# Patient Record
Sex: Female | Born: 1970 | Race: White | Hispanic: No | Marital: Married | Smoking: Never smoker
Health system: Southern US, Community
[De-identification: ages and names within clinical notes are randomized; demographics above are authoritative.]

## PROBLEM LIST (undated history)

## (undated) DIAGNOSIS — E785 Hyperlipidemia, unspecified: Secondary | ICD-10-CM

## (undated) HISTORY — PX: KNEE SURGERY: SHX244

## (undated) HISTORY — PX: CHOLECYSTECTOMY: SHX55

---

## 1997-11-15 ENCOUNTER — Other Ambulatory Visit: Admission: RE | Admit: 1997-11-15 | Discharge: 1997-11-15 | Payer: Self-pay | Admitting: Gynecology

## 1998-04-18 ENCOUNTER — Encounter: Admission: RE | Admit: 1998-04-18 | Discharge: 1998-07-17 | Payer: Self-pay | Admitting: Gynecology

## 1998-07-17 ENCOUNTER — Encounter: Admission: RE | Admit: 1998-07-17 | Discharge: 1998-10-15 | Payer: Self-pay | Admitting: Obstetrics and Gynecology

## 1998-10-10 ENCOUNTER — Inpatient Hospital Stay (HOSPITAL_COMMUNITY): Admission: AD | Admit: 1998-10-10 | Discharge: 1998-10-12 | Payer: Self-pay | Admitting: Gynecology

## 1998-10-17 ENCOUNTER — Encounter (HOSPITAL_COMMUNITY): Admission: RE | Admit: 1998-10-17 | Discharge: 1999-01-15 | Payer: Self-pay | Admitting: Gynecology

## 2003-04-18 ENCOUNTER — Other Ambulatory Visit: Admission: RE | Admit: 2003-04-18 | Discharge: 2003-04-18 | Payer: Self-pay | Admitting: Gynecology

## 2004-06-24 ENCOUNTER — Other Ambulatory Visit: Admission: RE | Admit: 2004-06-24 | Discharge: 2004-06-24 | Payer: Self-pay | Admitting: Gynecology

## 2004-09-29 ENCOUNTER — Inpatient Hospital Stay (HOSPITAL_COMMUNITY): Admission: AD | Admit: 2004-09-29 | Discharge: 2004-09-29 | Payer: Self-pay | Admitting: Gynecology

## 2004-10-15 ENCOUNTER — Encounter: Admission: RE | Admit: 2004-10-15 | Discharge: 2004-10-15 | Payer: Self-pay | Admitting: Women's Health

## 2005-01-11 ENCOUNTER — Inpatient Hospital Stay (HOSPITAL_COMMUNITY): Admission: AD | Admit: 2005-01-11 | Discharge: 2005-01-14 | Payer: Self-pay | Admitting: Gynecology

## 2005-01-11 ENCOUNTER — Encounter (INDEPENDENT_AMBULATORY_CARE_PROVIDER_SITE_OTHER): Payer: Self-pay | Admitting: Specialist

## 2005-01-15 ENCOUNTER — Encounter: Admission: RE | Admit: 2005-01-15 | Discharge: 2005-02-14 | Payer: Self-pay | Admitting: Gynecology

## 2005-02-25 ENCOUNTER — Other Ambulatory Visit: Admission: RE | Admit: 2005-02-25 | Discharge: 2005-02-25 | Payer: Self-pay | Admitting: Gynecology

## 2005-03-17 ENCOUNTER — Encounter: Admission: RE | Admit: 2005-03-17 | Discharge: 2005-04-16 | Payer: Self-pay | Admitting: Gynecology

## 2005-05-17 ENCOUNTER — Encounter: Admission: RE | Admit: 2005-05-17 | Discharge: 2005-06-16 | Payer: Self-pay | Admitting: Gynecology

## 2005-06-17 ENCOUNTER — Encounter: Admission: RE | Admit: 2005-06-17 | Discharge: 2005-07-16 | Payer: Self-pay | Admitting: Gynecology

## 2005-07-17 ENCOUNTER — Encounter: Admission: RE | Admit: 2005-07-17 | Discharge: 2005-08-16 | Payer: Self-pay | Admitting: Gynecology

## 2005-08-17 ENCOUNTER — Encounter: Admission: RE | Admit: 2005-08-17 | Discharge: 2005-09-16 | Payer: Self-pay | Admitting: Gynecology

## 2006-07-06 ENCOUNTER — Other Ambulatory Visit: Admission: RE | Admit: 2006-07-06 | Discharge: 2006-07-06 | Payer: Self-pay | Admitting: Gynecology

## 2008-03-22 ENCOUNTER — Other Ambulatory Visit: Admission: RE | Admit: 2008-03-22 | Discharge: 2008-03-22 | Payer: Self-pay | Admitting: Gynecology

## 2011-01-06 ENCOUNTER — Ambulatory Visit (INDEPENDENT_AMBULATORY_CARE_PROVIDER_SITE_OTHER): Payer: Commercial Indemnity | Admitting: Gynecology

## 2011-01-06 DIAGNOSIS — B373 Candidiasis of vulva and vagina: Secondary | ICD-10-CM

## 2011-01-06 DIAGNOSIS — Z01419 Encounter for gynecological examination (general) (routine) without abnormal findings: Secondary | ICD-10-CM

## 2011-01-06 DIAGNOSIS — B3731 Acute candidiasis of vulva and vagina: Secondary | ICD-10-CM

## 2011-01-10 ENCOUNTER — Other Ambulatory Visit (INDEPENDENT_AMBULATORY_CARE_PROVIDER_SITE_OTHER): Payer: Commercial Indemnity

## 2011-01-10 DIAGNOSIS — E7800 Pure hypercholesterolemia, unspecified: Secondary | ICD-10-CM

## 2011-02-03 ENCOUNTER — Other Ambulatory Visit: Payer: Commercial Indemnity

## 2011-02-14 NOTE — H&P (Signed)
[name], HAKIM NO.:  [id number]   MEDICAL RECORD NO.:  [id number]          PATIENT TYPE:  INP   LOCATION:  9198                          FACILITY:  [facility]   PHYSICIAN:  Charles A. Delcambre, MDDATE OF BIRTH:  November 17, 1970   DATE OF ADMISSION:  01/11/2005  DATE OF DISCHARGE:                                HISTORY & PHYSICAL   CHIEF COMPLAINT:  Onset of labor.   HISTORY OF PRESENT ILLNESS:  A 40 year old para 1-0-2-1, presents at 39  weeks, 5 days, complaining of onset of contractions 0300 this morning.  Noted to be 1 cm dilated this morning upon admission but having late  decelerations.  IV fluid, O2 was placed.  Position changes were noted, and  she developed moderate, recurrent decelerations, lasting at times a minute  in length and down to the 70s to 80s from 150s.  These were unresponsive to  resuscitative measures, and I recommended cesarean section in that she was  remote from delivery.  She gave informed consent, accepts risks of  infection, bleeding, bowel or bladder damage, blood product risk including  hepatitis and HIV exposure, ureteral damage, incision infection.  All  questions were answered; informed consent was given.   PAST MEDICAL HISTORY:  Gestational diabetes, diet controlled.   PAST SURGICAL HISTORY:  1.  Cholecystectomy.  2.  Vaginal delivery.  3.  Two ICVs.  4.  No D&C's.   ALLERGIES:  DILAUDID, CODEINE, reactions not specified.   MEDICATIONS:  Prenatal vitamins.   SOCIAL HISTORY:  No tobacco, ethanol, or drug use.  The patient is married.   FAMILY HISTORY:  Noncontributory.   REVIEW OF SYSTEMS:  No shortness of breath, wheezing.  She states she has T-  wave abnormality but asymptomatic no medications.   PRENATAL LABS:  See Hollister's but positive, rubella immune, last  hemoglobin at 28 weeks 12.9.   PHYSICAL EXAMINATION:  VITAL SIGNS:  Normal blood pressure per RN.  HEENT:  Grossly normal.  NECK:  Supple.  CORONARY:   Regular rate and rhythm.  ABDOMEN:  Gravid, consistent with term.  PELVIC:  1 cm per RN.  EXTREMITIES:  Mild edema.   Fetal heart rate as described above.   ASSESSMENT:  1.  Term pregnancy.  2.  Gestational diabetes, diet controlled.  3.  Nonreassuring fetal heart rate.   As far as ongoing labor, there is currently variability present but not  responsive fetal heart rate measured to reassuring status with resuscitative  measures enough to allow further trial of labor.  Recommendation for  cesarean section is made.  The patient consents.   PLAN:  Move to cesarean section.  We will proceed as outlined.      CAD/MEDQ  D:  01/11/2005  T:  01/11/2005  Job:  [id number]

## 2011-02-14 NOTE — Discharge Summary (Signed)
[name], [name]                ACCOUNT NO.:  [id number]   MEDICAL RECORD NO.:  [id number]          PATIENT TYPE:  INP   LOCATION:  9122                          FACILITY:  [facility]   PHYSICIAN:  Ivor Costa. Farrel Gobble, M.D. DATE OF BIRTH:  [birth year]   DATE OF ADMISSION:  01/11/2005  DATE OF DISCHARGE:                                 DISCHARGE SUMMARY   PRINCIPAL DIAGNOSES:  1.  Thirty-nine-plus-week pregnancy.  2.  Nonreassuring fetal heart rate tracing.   PRINCIPAL PROCEDURE:  Primary cesarean section.   HOSPITAL COURSE:  The patient presented to labor and delivery complaining of  contractions, was known to be minimally dilated, but was having recurrent  late decelerations despite normal resuscitative efforts, failed to improve,  and therefore the patient was transferred from triage to the OR for a  primary cesarean section for nonreassuring fetal heart rate tracing. The  patient is status post delivery of a viable female at 4 pounds 12 ounces  with assuring cord gases. Apgars were 7 and 9. The patient was then  transferred from the OR to the postpartum floor in normal fashion. Her  postpartum course was unremarkable. At the point of discharge - which is  postoperative day #3 - the patient was tolerating regular diet, ambulating  without difficulty, her pain was well controlled with over-the-counter  Motrin and Percocet. The patient was pumping and bottle feeding the baby.  The baby was ready for discharge.   POSTOPERATIVE LABORATORY DATA:  Her white count was 10.5, her hemoglobin was  10.4, her hematocrit was 29.7, her platelets were 148. Pathology:  Placenta,  which is pending.   DISCHARGE PRESCRIPTIONS:  Percocet 5 one to two q.4h. p.r.n. pain #30.   DISCHARGE CONDITION:  Stable. Instructed to follow up in the office in 6  weeks.      THL/MEDQ  D:  01/14/2005  T:  01/14/2005  Job:  [id number]

## 2011-02-14 NOTE — Op Note (Signed)
[name], [name]                ACCOUNT NO.:  [id number]   MEDICAL RECORD NO.:  [id number]          PATIENT TYPE:  INP   LOCATION:  9122                          [facility]ACILITY:  [facility]   PHYSICIAN:  Charles A. Delcambre, MDDATE OF BIRTH:  18-Mar-1971   DATE OF PROCEDURE:  01/11/2005  DATE OF DISCHARGE:                                 OPERATIVE REPORT   PREOPERATIVE DIAGNOSES:  1.  Intrauterine pregnancy at 39 weeks, five days.  2.  Class A1 diabetes mellitus, gestational.  3.  Nonreassuring fetal heart rate.   POSTOPERATIVE DIAGNOSES:  1.  Intrauterine pregnancy at 39 weeks, five days.  2.  Class A1 diabetes mellitus, gestational.  3.  Nonreassuring fetal heart rate.  4.  Nuchal cord x 2.  5.  Thick meconium.  6.  Probable intrauterine growth restriction.   PROCEDURE:  Primary low transverse cesarean section.   SURGEON:  Charles A. Sydnee Cabal, M.D.   ASSISTANT:  Ilda Mori, M.D.   COMPLICATIONS:  None.   ESTIMATED BLOOD LOSS:  800 mL.   SPECIMENS:  Placenta to pathology.   CORD BLOOD GAS:  Cord arterial blood gas 7.26, remainder of full gas not  received at this time.  Cord venous blood gas 7.31.   BABY'S WEIGHT:  4 pounds 12 ounces, vigorous female.   OPERATIVE FINDINGS:  Normal uterus and tubes and ovaries.   ANESTHESIA:  Spinal anesthesia.   COUNTS:  Instrument, sponge, and needle counts were correct x 2.   URINE OUTPUT:  200 mL clear urine.   DESCRIPTION OF PROCEDURE:  The patient was taken to the operating room and  placed in the supine position.  After spinal was induced, sterile prep and  drape was undertaken.  A Pfannenstiel incision was made with a knife,  carried down to fascia.  The fascia was incised with a knife and Mayo  scissors.  The rectus sheath was released superiorly and inferiorly.  The  rectus muscles were sharply dissected in the midline, the peritoneum was  entered bluntly.  Traction was used to extend the peritoneal incision.  The  bladder  blade was placed, vesicouterine peritoneum was incised with  Metzenbaum scissors.  Blunt dissection was used to develop the bladder flap.  The lower uterine segment incision was made after replacement of the bladder  flap to amniotomy.  There was no damage to the infant.  Traction was used to  extend the incision.  Thick meconium was noted.  The occiput was lifted, and  fundal pressure was placed by the operator's assistant.  DeLee suction was  done for the nares and the mouth prior to delivery of the shoulders.  Nuchal  cord was reduced x 2.  Cord was clamped, vigorous cry was noted, bulb  suction was done.  The infant was cut free and handed to the neonatologist  in attendance.  The placenta was expressed after cord gas and cord blood  were collected.  The placenta was sent to pathology.  Moistened lap was used  to wipe the uterus internal surface.  Then #1 chromic was used to close the  uterus in two layers, first layer running, locking, second layer imbricating  over the first, nonlocking.  Hemostasis was excellent.  Irrigation was  carried out.  Bladder flap hemostasis was excellent.  The paracolic gutters  were cleansed of clot or blood material.  Hemostasis was again verified,  subfascial hemostasis was excellent.  The fascia was then closed with #1  Vicryl, running, nonlocking suture.  Subcutaneous hemostasis was excellent,  2-0 Vicryl subcutaneous layer closure was undertaken.  Sterile skin clips  were used to close the skin.  A sterile dressing was applied.  The patient  was taken to the recovery room with physician in attendance, having  tolerated the procedure well.      CAD/MEDQ  D:  01/11/2005  T:  01/11/2005  Job:  [id number]

## 2011-12-16 ENCOUNTER — Encounter: Payer: Self-pay | Admitting: Gynecology

## 2013-05-04 ENCOUNTER — Encounter: Payer: Self-pay | Admitting: Gynecology

## 2013-05-30 ENCOUNTER — Emergency Department (INDEPENDENT_AMBULATORY_CARE_PROVIDER_SITE_OTHER): Payer: BC Managed Care – PPO

## 2013-05-30 ENCOUNTER — Emergency Department
Admission: EM | Admit: 2013-05-30 | Discharge: 2013-05-30 | Disposition: A | Discharge status: Home / Self Care | Payer: BC Managed Care – PPO | Source: Home / Self Care | Attending: Family Medicine | Admitting: Family Medicine

## 2013-05-30 ENCOUNTER — Encounter: Payer: Self-pay | Admitting: *Deleted

## 2013-05-30 DIAGNOSIS — R937 Abnormal findings on diagnostic imaging of other parts of musculoskeletal system: Secondary | ICD-10-CM

## 2013-05-30 DIAGNOSIS — M7730 Calcaneal spur, unspecified foot: Secondary | ICD-10-CM

## 2013-05-30 DIAGNOSIS — S83419A Sprain of medial collateral ligament of unspecified knee, initial encounter: Secondary | ICD-10-CM

## 2013-05-30 DIAGNOSIS — S82001A Unspecified fracture of right patella, initial encounter for closed fracture: Secondary | ICD-10-CM

## 2013-05-30 DIAGNOSIS — M25569 Pain in unspecified knee: Secondary | ICD-10-CM

## 2013-05-30 DIAGNOSIS — S82009A Unspecified fracture of unspecified patella, initial encounter for closed fracture: Secondary | ICD-10-CM

## 2013-05-30 DIAGNOSIS — S83411A Sprain of medial collateral ligament of right knee, initial encounter: Secondary | ICD-10-CM

## 2013-05-30 DIAGNOSIS — W19XXXA Unspecified fall, initial encounter: Secondary | ICD-10-CM

## 2013-05-30 HISTORY — DX: Hyperlipidemia, unspecified: E78.5

## 2013-05-30 NOTE — ED Notes (Signed)
Pt c/o RT knee and RT foot injury x yesterday after falling down the steps at home. She applied ice and took IBF last night. No OTC meds today.

## 2013-05-30 NOTE — ED Provider Notes (Signed)
CSN: [id number]     Arrival date & time 05/30/13  0931 History   First MD Initiated Contact with Patient 05/30/13 [phone]     Chief Complaint  Patient presents with  . Knee Injury  . Foot Injury      HPI Comments: Patient slipped yesterday and slid down a flight of carpeted stairs.  She believes that her right knee was hyperflexed, ending up with her right foot behind her laterally.  She complains of pain in her right medial knee, right pre-tibial area, and right lateral foot.  Patient is a 42 y.o. female presenting with knee pain. The history is provided by the patient and the spouse.  Knee Pain Location:  Leg, foot and knee Time since incident:  18 hours Injury: yes   Mechanism of injury: fall   Fall:    Fall occurred:  Down stairs   Impact surface:  Water quality scientist of impact:  Buttocks Leg location:  R lower leg Knee location:  R knee Foot location:  R foot Pain details:    Quality:  Aching   Radiates to:  Does not radiate   Severity:  Moderate   Onset quality:  Sudden   Duration:  18 hours   Timing:  Constant   Progression:  Unchanged Chronicity:  New Dislocation: no   Prior injury to area:  No Relieved by:  Nothing Worsened by:  Bearing weight Ineffective treatments:  NSAIDs Associated symptoms: decreased ROM, stiffness and swelling   Associated symptoms: no back pain, no muscle weakness, no numbness and no tingling   Risk factors: obesity     Past Medical History  Diagnosis Date  . Hyperlipemia    Past Surgical History  Procedure Laterality Date  . Cholecystectomy     Family History  Problem Relation Age of Onset  . Diabetes Mother   . Hypertension Mother   . Hyperlipidemia Mother   . Diabetes Father   . Hyperlipidemia Father   . Hypertension Father   . Cancer Sister     breast  . Hypertension Sister   . Hypertension Brother    History  Substance Use Topics  . Smoking status: Never Smoker   . Smokeless tobacco: Not on file  . Alcohol Use: No   OB  History   Grav Para Term Preterm Abortions TAB SAB Ect Mult Living                 Review of Systems  Musculoskeletal: Positive for stiffness. Negative for back pain.  All other systems reviewed and are negative.    Allergies  Dilaudid  Home Medications  No current outpatient prescriptions on file. BP 125/84  Pulse 78  Temp(Src) 97.9 F (36.6 C) (Oral)  Resp 18  SpO2 99%  LMP 05/09/2013 Physical Exam  Constitutional: She is oriented to person, place, and time. She appears well-developed and well-nourished. No distress.  HENT:  Head: Atraumatic.  Eyes: Conjunctivae are normal. Pupils are equal, round, and reactive to light.  Musculoskeletal:       Right knee: She exhibits decreased range of motion and bony tenderness. She exhibits no swelling, no effusion, no ecchymosis, no deformity, no laceration, no erythema, normal alignment, no LCL laxity, normal patellar mobility, normal meniscus and no MCL laxity. Tenderness found. Medial joint line and MCL tenderness noted. No lateral joint line, no LCL and no patellar tendon tenderness noted.       Left lower leg: She exhibits tenderness and bony tenderness. She exhibits  no swelling, no edema, no deformity and no laceration.       Legs:      Right foot: She exhibits tenderness and bony tenderness. She exhibits normal range of motion, no swelling, normal capillary refill, no crepitus, no deformity and no laceration.  There is distinct tenderness over the right MCL and joint line.  There is distinct point tenderness over the medial inferior aspect of patella.  Knee stable; negative drawer.  Patient has pain with full extension and flexion.  Right knee has minimal swelling.  There is tenderness over the lower pre-tibial area with minimal ecchymosis present and no swelling.  There is tenderness over the right foot, 5th metatarsal  Neurological: She is alert and oriented to person, place, and time.  Skin: Skin is warm and dry.    ED  Course  Procedures  none     Imaging Review Dg Tibia/fibula Right  05/30/2013   *RADIOLOGY REPORT*  Clinical Data: Recent traumatic injury with pain  RIGHT TIBIA AND FIBULA - 2 VIEW  Comparison: None.  Findings: No acute fracture or dislocation is noted.  No soft tissue changes are noted.  IMPRESSION: No acute abnormality noted.   Original Report Authenticated By: Alcide Clever, M.D.   Dg Knee Complete 4 Views Right  05/30/2013   *RADIOLOGY REPORT*  Clinical Data: Medial knee pain  RIGHT KNEE - COMPLETE 4+ VIEW  Comparison: None.  Findings: Bony density is noted adjacent to the medial aspect of the patella.  This is only seen on the oblique images.  This may represent a mild avulsion fracture.  No other focal abnormality is seen.  IMPRESSION: Question patellar avulsion fracture medially. Correlation with physical exam is recommended.   Original Report Authenticated By: Alcide Clever, M.D.   Dg Foot Complete Right  05/30/2013   *RADIOLOGY REPORT*  Clinical Data: Recent foot injury  RIGHT FOOT COMPLETE - 3+ VIEW  Comparison: None.  Findings: No acute fracture or dislocation is noted.  A small plantar spur is seen.  IMPRESSION: Small plantar spur.  No acute abnormality is noted.   Original Report Authenticated By: Alcide Clever, M.D.    MDM   1. Knee MCL sprain, right, initial encounter   2. Patella fracture, right, closed, initial encounter    Knee immobilizer applied.  Dispensed crutches. Elevate leg.  Apply ice pack several times daily.  May take Tylenol for pain. Followup with Dr. Rodney Langton in about two days.    Lattie Haw, MD 05/30/13 [phone]

## 2013-05-31 ENCOUNTER — Telehealth: Payer: Self-pay | Admitting: *Deleted

## 2013-06-02 ENCOUNTER — Ambulatory Visit (INDEPENDENT_AMBULATORY_CARE_PROVIDER_SITE_OTHER): Payer: BC Managed Care – PPO | Admitting: Sports Medicine

## 2013-06-02 ENCOUNTER — Ambulatory Visit (INDEPENDENT_AMBULATORY_CARE_PROVIDER_SITE_OTHER): Payer: BC Managed Care – PPO

## 2013-06-02 ENCOUNTER — Encounter: Payer: Self-pay | Admitting: Sports Medicine

## 2013-06-02 ENCOUNTER — Telehealth: Payer: Self-pay | Admitting: *Deleted

## 2013-06-02 VITALS — BP 110/77 | HR 78 | Wt 222.0 lb

## 2013-06-02 DIAGNOSIS — M25561 Pain in right knee: Secondary | ICD-10-CM

## 2013-06-02 DIAGNOSIS — M25569 Pain in unspecified knee: Secondary | ICD-10-CM

## 2013-06-02 DIAGNOSIS — M1711 Unilateral primary osteoarthritis, right knee: Secondary | ICD-10-CM | POA: Insufficient documentation

## 2013-06-02 DIAGNOSIS — M898X9 Other specified disorders of bone, unspecified site: Secondary | ICD-10-CM

## 2013-06-02 NOTE — Telephone Encounter (Signed)
CT right knee w/o contrast percerted through [employer].  Auth # [id number].  Myriam Jacobson with [facility] notified. Barry Dienes, LPN

## 2013-06-02 NOTE — Progress Notes (Signed)
   Subjective:    I'm seeing this patient as a consultation for:  Dr. Cathren Harsh  Primary care provider: Dr. Blair Heys.  CC: Right knee pain  HPI: This is a very pleasant 42 year old female, unfortunately 3 days ago she fell down the stairs hyperflexing her right knee. She had immediate pain and swelling. She was seen in the urgent care where x-rays showed an abnormality just lateral to the patella. She was referred to me for further evaluation and definitive treatment. She really doesn't have much pain around her knee, predominately her pain is at the medial tibial insertion of the MCL. Pain is severe, persistent, no radiation.  Past medical history, Surgical history, Family history not pertinant except as noted below, Social history, Allergies, and medications have been entered into the medical record, reviewed, and no changes needed.   Review of Systems: No headache, visual changes, nausea, vomiting, diarrhea, constipation, dizziness, abdominal pain, skin rash, fevers, chills, night sweats, weight loss, swollen lymph nodes, body aches, joint swelling, muscle aches, chest pain, shortness of breath, mood changes, visual or auditory hallucinations.   Objective:   General: Well Developed, well nourished, and in no acute distress.  Neuro/Psych: Alert and oriented x3, extra-ocular muscles intact, able to move all 4 extremities, sensation grossly intact. Skin: Warm and dry, no rashes noted.  Respiratory: Not using accessory muscles, speaking in full sentences, trachea midline.  Cardiovascular: Pulses palpable, no extremity edema. Abdomen: Does not appear distended. Right knee: Minimally swollen, no tenderness to palpation around the patella, extensor mechanism is intact. She does have tenderness along the entire medial collateral ligament, anterior cruciate ligament, PCL, and LCL are all stable. There is no joint line opening was applied valgus stress.  I reviewed her x-rays, there does appear  to be a lesion along the medial patellar margin that may represent a patellar avulsion, however she has no pain in this area.  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 There is some stranding around the MCL, no fractures are noted.  Impression and Recommendations:   This case required medical decision making of moderate complexity.

## 2013-06-02 NOTE — Assessment & Plan Note (Addendum)
[name] is fairly asymptomatic around the patella, she has marked tenderness over the MCL, however this ligament is stable. I do suspect that this represents a grade 2 MCL sprain. I do see a defect on the lateral femoral condyle, for this reason I would like a CT scan of her knee to further evaluate bony injury. Hinged knee brace, pain is well controlled with Tylenol. Like to see her back in about 2 weeks, she should stay on crutches for now.  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

## 2013-06-16 ENCOUNTER — Encounter: Payer: Self-pay | Admitting: Sports Medicine

## 2013-06-16 ENCOUNTER — Ambulatory Visit (INDEPENDENT_AMBULATORY_CARE_PROVIDER_SITE_OTHER): Payer: BC Managed Care – PPO | Admitting: Sports Medicine

## 2013-06-16 VITALS — BP 107/73 | HR 71 | Wt 222.0 lb

## 2013-06-16 DIAGNOSIS — M25569 Pain in unspecified knee: Secondary | ICD-10-CM

## 2013-06-16 DIAGNOSIS — M25561 Pain in right knee: Secondary | ICD-10-CM

## 2013-06-16 NOTE — Assessment & Plan Note (Signed)
Likely this represents an MCL sprain, 70% improved. Continue knee brace. 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 Continue ibuprofen as needed. Discontinue crutches, return to see me in 2 weeks, I will likely turn her loose afterwards.

## 2013-06-16 NOTE — Progress Notes (Signed)
  Subjective:    CC: Followup  HPI: Right knee pain: [name] had an MCL sprain approximately 2-3 weeks ago. I placed her in a hinged knee brace, advised her to rest. She returns today approximately 70% improved, able to bear weight without pain.  Past medical history, Surgical history, Family history not pertinant except as noted below, Social history, Allergies, and medications have been entered into the medical record, reviewed, and no changes needed.   Review of Systems: No fevers, chills, night sweats, weight loss, chest pain, or shortness of breath.   Objective:    General: Well Developed, well nourished, and in no acute distress.  Neuro: Alert and oriented x3, extra-ocular muscles intact, sensation grossly intact.  HEENT: Normocephalic, atraumatic, pupils equal round reactive to light, neck supple, no masses, no lymphadenopathy, thyroid nonpalpable.  Skin: Warm and dry, no rashes. Cardiac: Regular rate and rhythm, no murmurs rubs or gallops, no lower extremity edema.  Respiratory: Clear to auscultation bilaterally. Not using accessory muscles, speaking in full sentences. Right Knee: Normal to inspection with no erythema or effusion or obvious bony abnormalities. Palpation normal with no warmth, joint line tenderness, patellar tenderness, or condyle tenderness. ROM full in flexion and extension and lower leg rotation. Ligaments with solid consistent endpoints including ACL, PCL, LCL, MCL. MCL still has extremely mild laxity with approximately 3 of joint line opening with valgus stress. Negative Mcmurray's, Apley's, and Thessalonian tests. Non painful patellar compression. Patellar glide without crepitus. Patellar and quadriceps tendons unremarkable. Hamstring and quadriceps strength is normal.   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  Impression and Recommendations:

## 2013-06-30 ENCOUNTER — Encounter: Payer: Self-pay | Admitting: Sports Medicine

## 2013-06-30 ENCOUNTER — Ambulatory Visit (INDEPENDENT_AMBULATORY_CARE_PROVIDER_SITE_OTHER): Payer: BC Managed Care – PPO | Admitting: Sports Medicine

## 2013-06-30 VITALS — BP 107/79 | HR 80 | Wt 220.0 lb

## 2013-06-30 DIAGNOSIS — M25579 Pain in unspecified ankle and joints of unspecified foot: Secondary | ICD-10-CM

## 2013-06-30 DIAGNOSIS — M25561 Pain in right knee: Secondary | ICD-10-CM

## 2013-06-30 DIAGNOSIS — M25569 Pain in unspecified knee: Secondary | ICD-10-CM

## 2013-06-30 DIAGNOSIS — M25571 Pain in right ankle and joints of right foot: Secondary | ICD-10-CM | POA: Insufficient documentation

## 2013-06-30 NOTE — Assessment & Plan Note (Signed)
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 This continues to improve, and she is able to jump up and down the right leg today. Discontinue brace.

## 2013-06-30 NOTE — Progress Notes (Signed)
  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 She continues to improve on a daily basis. Symptoms are mild, she is not limited in function at all.  Right ankle pain: Occasionally gets swelling, pain over the dorsal lateral midfoot, worse when bearing weight, no radiation, moderate.  Past medical history, Surgical history, Family history not pertinant except as noted below, Social history, Allergies, and medications have been entered into the medical record, reviewed, and no changes needed.   Review of Systems: No fevers, chills, night sweats, weight loss, chest pain, or shortness of breath.   Objective:    General: Well Developed, well nourished, and in no acute distress.  Neuro: Alert and oriented x3, extra-ocular muscles intact, sensation grossly intact.  HEENT: Normocephalic, atraumatic, pupils equal round reactive to light, neck supple, no masses, no lymphadenopathy, thyroid nonpalpable.  Skin: Warm and dry, no rashes. Cardiac: Regular rate and rhythm, no murmurs rubs or gallops, no lower extremity edema.  Respiratory: Clear to auscultation bilaterally. Not using accessory muscles, speaking in full sentences. Right Knee: Normal to inspection with no erythema or effusion or obvious bony abnormalities. Palpation normal with no warmth, joint line tenderness, patellar tenderness, or condyle tenderness. ROM full in flexion and extension and lower leg rotation. Ligaments with solid consistent endpoints including ACL, PCL, LCL, MCL. Negative Mcmurray's, Apley's, and Thessalonian tests. Non painful patellar compression. Patellar glide without crepitus. Patellar and quadriceps tendons unremarkable. Hamstring and quadriceps strength is normal.  Right Ankle: No visible erythema or swelling. Range of motion is full in all directions. Strength is  5/5 in all directions. Stable lateral and medial ligaments; squeeze test and kleiger test unremarkable; Talar dome nontender; No pain at base of 5th MT; No tenderness over cuboid; No tenderness over N spot or navicular prominence No tenderness on posterior aspects of lateral and medial malleolus No sign of peroneal tendon subluxations or tenderness to palpation Negative tarsal tunnel tinel's Able to walk 4 steps. Patient is able to jump up and down easily on the affected extremity.  Impression and Recommendations:

## 2013-06-30 NOTE — Assessment & Plan Note (Signed)
This continues to improve and has not yet plateaued. Likely does represent soft tissue contusion and ligamentous sprain. If plateaus, or does not get better in 4 weeks we will MRI the ankle.

## 2013-07-11 ENCOUNTER — Encounter: Payer: Self-pay | Admitting: Sports Medicine

## 2013-07-11 ENCOUNTER — Ambulatory Visit (INDEPENDENT_AMBULATORY_CARE_PROVIDER_SITE_OTHER): Payer: BC Managed Care – PPO | Admitting: Sports Medicine

## 2013-07-11 VITALS — BP 133/88 | HR 88 | Wt 223.0 lb

## 2013-07-11 DIAGNOSIS — M25571 Pain in right ankle and joints of right foot: Secondary | ICD-10-CM

## 2013-07-11 DIAGNOSIS — M25561 Pain in right knee: Secondary | ICD-10-CM

## 2013-07-11 DIAGNOSIS — M25569 Pain in unspecified knee: Secondary | ICD-10-CM

## 2013-07-11 DIAGNOSIS — M25579 Pain in unspecified ankle and joints of unspecified foot: Secondary | ICD-10-CM

## 2013-07-11 NOTE — Progress Notes (Signed)
  Subjective:    CC: Followup  HPI: Right knee pain: Improvement seems to have plateaued, initial diagnosis was MCL sprain. She now has pain which he localizes at the posterior medial joint line, worse with flexing her knee. Pain is moderate, persistent.  Ankle pain: Persistent, has not yet been doing her home exercises.  Past medical history, Surgical history, Family history not pertinant except as noted below, Social history, Allergies, and medications have been entered into the medical record, reviewed, and no changes needed.   Review of Systems: No fevers, chills, night sweats, weight loss, chest pain, or shortness of breath.   Objective:    General: Well Developed, well nourished, and in no acute distress.  Neuro: Alert and oriented x3, extra-ocular muscles intact, sensation grossly intact.  HEENT: Normocephalic, atraumatic, pupils equal round reactive to light, neck supple, no masses, no lymphadenopathy, thyroid nonpalpable.  Skin: Warm and dry, no rashes. Cardiac: Regular rate and rhythm, no murmurs rubs or gallops, no lower extremity edema.  Respiratory: Clear to auscultation bilaterally. Not using accessory muscles, speaking in full sentences.  Procedure: Real-time Ultrasound Guided Injection of right knee Device: GE Logiq E  Verbal informed consent obtained.  Time-out conducted.  Noted no overlying erythema, induration, or other signs of local infection.  Skin prepped in a sterile fashion.  Local anesthesia: Topical Ethyl chloride.  With sterile technique and under real time ultrasound guidance:  2 cc Kenalog 40, 4 cc lidocaine injected easily into the suprapatellar recess. Completed without difficulty  Pain immediately resolved suggesting accurate placement of the medication.  Advised to call if fevers/chills, erythema, induration, drainage, or persistent bleeding.  Images permanently stored and available for review in the ultrasound unit.  Impression: Technically  successful ultrasound guided injection.  Impression and Recommendations:

## 2013-07-11 NOTE — Assessment & Plan Note (Signed)
Pain is persistent but she has not been doing any rehabilitation exercises, and add some home exercises, she can return in 2 weeks, MRI if no better.

## 2013-07-11 NOTE — Assessment & Plan Note (Signed)
After a grade 2/3 MCL sprain, persistent pain in the posterior medial as well as into her medial joint lines suggesting meniscal injury. Injection performed today. Return in 2 weeks, we'll consider MRI if no better.

## 2013-07-25 ENCOUNTER — Encounter: Payer: Self-pay | Admitting: Sports Medicine

## 2013-07-25 ENCOUNTER — Ambulatory Visit (INDEPENDENT_AMBULATORY_CARE_PROVIDER_SITE_OTHER): Payer: BC Managed Care – PPO | Admitting: Sports Medicine

## 2013-07-25 VITALS — BP 117/80 | HR 84 | Wt 219.0 lb

## 2013-07-25 DIAGNOSIS — M25579 Pain in unspecified ankle and joints of unspecified foot: Secondary | ICD-10-CM

## 2013-07-25 DIAGNOSIS — M25561 Pain in right knee: Secondary | ICD-10-CM

## 2013-07-25 DIAGNOSIS — M25569 Pain in unspecified knee: Secondary | ICD-10-CM

## 2013-07-25 DIAGNOSIS — M25571 Pain in right ankle and joints of right foot: Secondary | ICD-10-CM

## 2013-07-25 NOTE — Assessment & Plan Note (Signed)
Good result from injection, unfortunately still has pain at the distal insertion of the MCL, with reproduction with valgus stress. She has an effusion. Certainly we need to rule out further intra-articular pathology such as an ACL tear or meniscal tear. I would also like to further characterize her MCL injury. At this point I am going to obtain an MRI.

## 2013-07-25 NOTE — Assessment & Plan Note (Addendum)
I think this is mostly due to dependent edema from her knee injury. I have asked her to elevated, and get a compression stocking as we work up her  knee further.

## 2013-07-25 NOTE — Progress Notes (Signed)
  Subjective:    CC: Follow up  HPI: Right knee pain: [name] returns, it's been almost 2 months since her injury where she fell down the stairs, her initial diagnosis was an MCL injury. She improved significantly with mobilization, unfortunately at the last visit she was having persistent pain, I injected her knee joint. She returns today with pain improved significantly after the injection, but still has occasional sharp pains that she localizes at the tibial insertion of her MCL. The knee is also swelling. No constitutional symptoms, he is moderate, persistent.  Past medical history, Surgical history, Family history not pertinant except as noted below, Social history, Allergies, and medications have been entered into the medical record, reviewed, and no changes needed.   Review of Systems: No fevers, chills, night sweats, weight loss, chest pain, or shortness of breath.   Objective:    General: Well Developed, well nourished, and in no acute distress.  Neuro: Alert and oriented x3, extra-ocular muscles intact, sensation grossly intact.  HEENT: Normocephalic, atraumatic, pupils equal round reactive to light, neck supple, no masses, no lymphadenopathy, thyroid nonpalpable.  Skin: Warm and dry, no rashes. Cardiac: Regular rate and rhythm, no murmurs rubs or gallops, no lower extremity edema.  Respiratory: Clear to auscultation bilaterally. Not using accessory muscles, speaking in full sentences. Right Knee: Visible and palpable effusion. Tender to palpation of the tibial insertion of the MCL, reproduction of pain with valgus stress on the knee, valgus stress does not cause any joint line opening. ROM full in flexion and extension and lower leg rotation. Ligaments with solid consistent endpoints including ACL, PCL, LCL, MCL. Negative Mcmurray's, Apley's, and Thessalonian tests. Non painful patellar compression. Patellar glide without crepitus. Patellar and quadriceps tendons  unremarkable. Hamstring and quadriceps strength is normal.   Impression and Recommendations:

## 2013-07-26 ENCOUNTER — Ambulatory Visit (HOSPITAL_BASED_OUTPATIENT_CLINIC_OR_DEPARTMENT_OTHER)
Admission: RE | Admit: 2013-07-26 | Discharge: 2013-07-26 | Disposition: A | Discharge status: Home / Self Care | Payer: BC Managed Care – PPO | Source: Ambulatory Visit | Attending: Sports Medicine | Admitting: Sports Medicine

## 2013-07-26 ENCOUNTER — Telehealth: Payer: Self-pay | Admitting: *Deleted

## 2013-07-26 DIAGNOSIS — M2240 Chondromalacia patellae, unspecified knee: Secondary | ICD-10-CM | POA: Insufficient documentation

## 2013-07-26 DIAGNOSIS — S83289A Other tear of lateral meniscus, current injury, unspecified knee, initial encounter: Secondary | ICD-10-CM | POA: Insufficient documentation

## 2013-07-26 DIAGNOSIS — M25561 Pain in right knee: Secondary | ICD-10-CM

## 2013-07-26 DIAGNOSIS — X58XXXA Exposure to other specified factors, initial encounter: Secondary | ICD-10-CM | POA: Insufficient documentation

## 2013-07-26 DIAGNOSIS — M76899 Other specified enthesopathies of unspecified lower limb, excluding foot: Secondary | ICD-10-CM | POA: Insufficient documentation

## 2013-07-26 NOTE — Telephone Encounter (Signed)
PA obtained for MRI RT knee w/o contrast. Auth # [id number]. Meyer Cory, LPN

## 2013-07-28 ENCOUNTER — Ambulatory Visit (INDEPENDENT_AMBULATORY_CARE_PROVIDER_SITE_OTHER): Payer: BC Managed Care – PPO | Admitting: Sports Medicine

## 2013-07-28 ENCOUNTER — Encounter: Payer: Self-pay | Admitting: Sports Medicine

## 2013-07-28 VITALS — BP 121/74 | HR 80 | Wt 221.0 lb

## 2013-07-28 DIAGNOSIS — M25571 Pain in right ankle and joints of right foot: Secondary | ICD-10-CM

## 2013-07-28 DIAGNOSIS — M25561 Pain in right knee: Secondary | ICD-10-CM

## 2013-07-28 DIAGNOSIS — M25569 Pain in unspecified knee: Secondary | ICD-10-CM

## 2013-07-28 MED ORDER — TRAMADOL HCL 50 MG PO TABS: ORAL_TABLET | ORAL | Status: DC

## 2013-07-28 NOTE — Assessment & Plan Note (Signed)
She is wearing a compression dressing and feels better.

## 2013-07-28 NOTE — Progress Notes (Signed)
  Subjective:    CC: MRI results  HPI: Right knee pain: Is over 2 months since [name] fell down the stairs injuring her right knee. Clinically I diagnosed her with an MCL sprain. We placed her on rehabilitation, and immobilization, unfortunately she has continued to have pain. More recently I injected her knee which provided approximately 2 weeks of response. Unfortunate she returns with recurrence in pain. MRI results will be dictated below.  Pain is moderate, persistent, still localized at the medial joint line, but also deep to the patella. Pain is worse when going up and down stairs. She does get a grinding sensation.  Past medical history, Surgical history, Family history not pertinant except as noted below, Social history, Allergies, and medications have been entered into the medical record, reviewed, and no changes needed.   Review of Systems: No fevers, chills, night sweats, weight loss, chest pain, or shortness of breath.   Objective:    General: Well Developed, well nourished, and in no acute distress.  Neuro: Alert and oriented x3, extra-ocular muscles intact, sensation grossly intact.  HEENT: Normocephalic, atraumatic, pupils equal round reactive to light, neck supple, no masses, no lymphadenopathy, thyroid nonpalpable.  Skin: Warm and dry, no rashes. Cardiac: Regular rate and rhythm, no murmurs rubs or gallops, no lower extremity edema.  Respiratory: Clear to auscultation bilaterally. Not using accessory muscles, speaking in full sentences. Right Knee: Visible and palpable effusion with a fluid wave. Tender to palpation at the tibial insertion of the MCL. No tenderness to palpation over the lateral joint line in its entirety. ROM full in flexion and extension and lower leg rotation. Ligaments with solid consistent endpoints including ACL, PCL, LCL, MCL. Negative Mcmurray's, Apley's, and Thessalonian tests. Painful patellar compression with crepitus. Patellar and quadriceps  tendons unremarkable. Hamstring and quadriceps strength is normal.   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  Impression and Recommendations:

## 2013-07-28 NOTE — Assessment & Plan Note (Addendum)
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 She also has MRI evidence of a grade 2 MCL sprain. She does have also a lateral meniscal tear which is asymptomatic. She's had a single injection which was helped for 2 weeks, she is going on a trip, and advised that she come back next week just before her trip for a repeat aspiration and injection this time likely with Depo-Medrol. If this is ineffective we can certainly transition to Visco supplementation before consideration of arthroscopy. I would also like her to do some formal physical therapy. Tramadol as needed for pain.

## 2013-07-29 ENCOUNTER — Ambulatory Visit: Payer: BC Managed Care – PPO | Admitting: Sports Medicine

## 2013-08-04 ENCOUNTER — Encounter: Payer: Self-pay | Admitting: Sports Medicine

## 2013-08-04 ENCOUNTER — Ambulatory Visit (INDEPENDENT_AMBULATORY_CARE_PROVIDER_SITE_OTHER): Payer: BC Managed Care – PPO | Admitting: Sports Medicine

## 2013-08-04 VITALS — BP 105/76 | HR 66 | Wt 218.0 lb

## 2013-08-04 DIAGNOSIS — M25561 Pain in right knee: Secondary | ICD-10-CM

## 2013-08-04 DIAGNOSIS — M25569 Pain in unspecified knee: Secondary | ICD-10-CM

## 2013-08-04 NOTE — Progress Notes (Signed)
  Subjective:    CC: Follow up  HPI: [name] returns with her knee pain, she does have a trip to [place] coming up, we aspirated and injected her knee in the past, and she had a good temporary response. She does want some pain relief during her vacation, and does desire repeat interventional treatment. Pain is localized under the kneecap, and at the medial joint line, moderate, persistent without radiation.  Past medical history, Surgical history, Family history not pertinant except as noted below, Social history, Allergies, and medications have been entered into the medical record, reviewed, and no changes needed.   Review of Systems: No fevers, chills, night sweats, weight loss, chest pain, or shortness of breath.   Objective:    General: Well Developed, well nourished, and in no acute distress.  Neuro: Alert and oriented x3, extra-ocular muscles intact, sensation grossly intact.  HEENT: Normocephalic, atraumatic, pupils equal round reactive to light, neck supple, no masses, no lymphadenopathy, thyroid nonpalpable.  Skin: Warm and dry, no rashes. Cardiac: Regular rate and rhythm, no murmurs rubs or gallops, no lower extremity edema.  Respiratory: Clear to auscultation bilaterally. Not using accessory muscles, speaking in full sentences.  Procedure: Real-time Ultrasound Guided Injection of right knee Device: GE Logiq E  Verbal informed consent obtained.  Time-out conducted.  Noted no overlying erythema, induration, or other signs of local infection.  Skin prepped in a sterile fashion.  Local anesthesia: Topical Ethyl chloride.  With sterile technique and under real time ultrasound guidance:  2 cc Kenalog 40, 4 cc lidocaine injected easily into the suprapatellar recess. Completed without difficulty  Pain immediately resolved suggesting accurate placement of the medication.  Advised to call if fevers/chills, erythema, induration, drainage, or persistent bleeding.  Images permanently  stored and available for review in the ultrasound unit.  Impression: Technically successful ultrasound guided injection.  Impression and Recommendations:

## 2013-08-04 NOTE — Assessment & Plan Note (Signed)
Repeat injection. Return on an as-needed basis for now.

## 2013-08-05 ENCOUNTER — Ambulatory Visit (INDEPENDENT_AMBULATORY_CARE_PROVIDER_SITE_OTHER): Payer: BC Managed Care – PPO | Admitting: Physical Therapy

## 2013-08-05 DIAGNOSIS — M25569 Pain in unspecified knee: Secondary | ICD-10-CM

## 2013-08-05 DIAGNOSIS — M6281 Muscle weakness (generalized): Secondary | ICD-10-CM

## 2013-08-05 DIAGNOSIS — R609 Edema, unspecified: Secondary | ICD-10-CM

## 2013-09-02 ENCOUNTER — Encounter: Payer: Self-pay | Admitting: Sports Medicine

## 2014-03-15 ENCOUNTER — Telehealth: Payer: Self-pay

## 2014-03-15 DIAGNOSIS — M25569 Pain in unspecified knee: Secondary | ICD-10-CM

## 2014-03-15 NOTE — Telephone Encounter (Signed)
Referral placed to go [facility]

## 2014-03-15 NOTE — Telephone Encounter (Signed)
Patient called request a referral for [facility] or [facility]. Rhonda Cunningham,CMA

## 2014-06-23 ENCOUNTER — Other Ambulatory Visit (HOSPITAL_COMMUNITY)
Admission: RE | Admit: 2014-06-23 | Discharge: 2014-06-23 | Disposition: A | Discharge status: Home / Self Care | Payer: 59 | Source: Ambulatory Visit | Attending: Gynecology | Admitting: Gynecology

## 2014-06-23 ENCOUNTER — Encounter: Payer: Self-pay | Admitting: Gynecology

## 2014-06-23 ENCOUNTER — Ambulatory Visit (INDEPENDENT_AMBULATORY_CARE_PROVIDER_SITE_OTHER): Payer: 59 | Admitting: Gynecology

## 2014-06-23 VITALS — BP 120/70 | Ht 66.0 in | Wt 221.0 lb

## 2014-06-23 DIAGNOSIS — Z01419 Encounter for gynecological examination (general) (routine) without abnormal findings: Secondary | ICD-10-CM

## 2014-06-23 DIAGNOSIS — Z1151 Encounter for screening for human papillomavirus (HPV): Secondary | ICD-10-CM | POA: Insufficient documentation

## 2014-06-23 LAB — CBC WITH DIFFERENTIAL/PLATELET
BASOS ABS: 0 10*3/uL (ref 0.0–0.1)
BASOS PCT: 0 % (ref 0–1)
Eosinophils Absolute: 0.2 10*3/uL (ref 0.0–0.7)
Eosinophils Relative: 3 % (ref 0–5)
HCT: 40.3 % (ref 36.0–46.0)
Hemoglobin: 14.2 g/dL (ref 12.0–15.0)
LYMPHS PCT: 32 % (ref 12–46)
Lymphs Abs: 2.4 10*3/uL (ref 0.7–4.0)
MCH: 29.9 pg (ref 26.0–34.0)
MCHC: 35.2 g/dL (ref 30.0–36.0)
MCV: 84.8 fL (ref 78.0–100.0)
Monocytes Absolute: 0.5 10*3/uL (ref 0.1–1.0)
Monocytes Relative: 7 % (ref 3–12)
NEUTROS ABS: 4.4 10*3/uL (ref 1.7–7.7)
NEUTROS PCT: 58 % (ref 43–77)
PLATELETS: 275 10*3/uL (ref 150–400)
RBC: 4.75 MIL/uL (ref 3.87–5.11)
RDW: 12.9 % (ref 11.5–15.5)
WBC: 7.6 10*3/uL (ref 4.0–10.5)

## 2014-06-23 LAB — COMPREHENSIVE METABOLIC PANEL
ALBUMIN: 4.4 g/dL (ref 3.5–5.2)
ALK PHOS: 83 U/L (ref 39–117)
ALT: 35 U/L (ref 0–35)
AST: 21 U/L (ref 0–37)
BUN: 10 mg/dL (ref 6–23)
CALCIUM: 9.2 mg/dL (ref 8.4–10.5)
CHLORIDE: 104 meq/L (ref 96–112)
CO2: 20 mEq/L (ref 19–32)
CREATININE: 0.84 mg/dL (ref 0.50–1.10)
Glucose, Bld: 88 mg/dL (ref 70–99)
POTASSIUM: 4.2 meq/L (ref 3.5–5.3)
Sodium: 140 mEq/L (ref 135–145)
Total Bilirubin: 0.6 mg/dL (ref 0.2–1.2)
Total Protein: 6.6 g/dL (ref 6.0–8.3)

## 2014-06-23 LAB — LIPID PANEL
CHOL/HDL RATIO: 4.1 ratio
Cholesterol: 213 mg/dL — ABNORMAL HIGH (ref 0–200)
HDL: 52 mg/dL (ref >39)
LDL CALC: 115 mg/dL — AB (ref 0–99)
Triglycerides: 229 mg/dL — ABNORMAL HIGH (ref <150)
VLDL: 46 mg/dL — ABNORMAL HIGH (ref 0–40)

## 2014-06-23 LAB — TSH: TSH: 2.044 u[IU]/mL (ref 0.350–4.500)

## 2014-06-23 NOTE — Progress Notes (Signed)
KHRYSTINA BONNES Aug 28, 1971 [id number]        43 y.o.  [id number] for annual exam.  Has not been in for over 3 years. Overall doing well.  Past medical history,surgical history, problem list, medications, allergies, family history and social history were all reviewed and documented as reviewed in the EPIC chart.  ROS:  12 system ROS performed with pertinent positives and negatives included in the history, assessment and plan.   Additional significant findings :  none   Exam: [facility] person Vitals:   06/23/14 0915  BP: 120/70  Height:  (1.676 m)  Weight: 221 lb (100.245 kg)   General appearance:  Normal affect, orientation and appearance. Skin: Grossly normal HEENT: Without gross lesions.  No cervical or supraclavicular adenopathy. Thyroid normal.  Lungs:  Clear without wheezing, rales or rhonchi Cardiac: RR, without RMG Abdominal:  Soft, nontender, without masses, guarding, rebound, organomegaly or hernia Breasts:  Examined lying and sitting without masses, retractions, discharge or axillary adenopathy. Pelvic:  Ext/BUS/vagina normal  Cervix normal. Pap done  Uterus anteverted, normal size, shape and contour, midline and mobile nontender   Adnexa  Without masses or tenderness    Anus and perineum  Normal   Rectovaginal  Normal sphincter tone without palpated masses or tenderness.    Assessment/Plan:  43 y.o. [id number] female for annual exam with regular menses, condom contraception.   1. Menstrual changes.  Patient notes that her menses are getting a little closer together and she now is having classic menstrual headaches the day before her period starts. I reviewed with her that this is not unusual for cycles to change into the 40s. She has no intermenstrual bleeding or other symptoms. Offered prescription strength nonsteroidal anti-inflammatory to try for the headaches but said OTC medications are doing well. Option for premenstrual estrogen supplementation also discussed. At  this point patient just wants to monitor. Will represent it becomes a significant issue. Will 2. Contraception. I again reviewed contraceptive options. She is happy with condoms and understands and accepts the failure risks. Availability of plan B reviewed. 3. Pap smear 2012.  Pap/HPV today. No history of significant abnormal Pap smears previously. 4. Mammography 04/2014. Continue with annual mammography. SBE monthly reviewed. 5. Health maintenance. Baseline CBC comprehensive metabolic panel lipid profile TSH urinalysis ordered. Follow up in one year, sooner as needed.     Dara Lords MD, 10:02 AM 06/23/2014

## 2014-06-23 NOTE — Patient Instructions (Signed)
You may obtain a copy of any labs that were done today by logging onto MyChart as outlined in the instructions provided with your AVS (after visit summary). The office will not call with normal lab results but certainly if there are any significant abnormalities then we will contact you.   Health Maintenance, Female A healthy lifestyle and preventative care can promote health and wellness.  Maintain regular health, dental, and eye exams.  Eat a healthy diet. Foods like vegetables, fruits, whole grains, low-fat dairy products, and lean protein foods contain the nutrients you need without too many calories. Decrease your intake of foods high in solid fats, added sugars, and salt. Get information about a proper diet from your caregiver, if necessary.  Regular physical exercise is one of the most important things you can do for your health. Most adults should get at least 150 minutes of moderate-intensity exercise (any activity that increases your heart rate and causes you to sweat) each week. In addition, most adults need muscle-strengthening exercises on 2 or more days a week.   Maintain a healthy weight. The body mass index (BMI) is a screening tool to identify possible weight problems. It provides an estimate of body fat based on height and weight. Your caregiver can help determine your BMI, and can help you achieve or maintain a healthy weight. For adults 20 years and older:  A BMI below 18.5 is considered underweight.  A BMI of 18.5 to 24.9 is normal.  A BMI of 25 to 29.9 is considered overweight.  A BMI of 30 and above is considered obese.  Maintain normal blood lipids and cholesterol by exercising and minimizing your intake of saturated fat. Eat a balanced diet with plenty of fruits and vegetables. Blood tests for lipids and cholesterol should begin at age 61 and be repeated every 5 years. If your lipid or cholesterol levels are high, you are over 50, or you are a high risk for heart  disease, you may need your cholesterol levels checked more frequently.Ongoing high lipid and cholesterol levels should be treated with medicines if diet and exercise are not effective.  If you smoke, find out from your caregiver how to quit. If you do not use tobacco, do not start.  Lung cancer screening is recommended for adults aged 33 80 years who are at high risk for developing lung cancer because of a history of smoking. Yearly low-dose computed tomography (CT) is recommended for people who have at least a 30-pack-year history of smoking and are a current smoker or have quit within the past 15 years. A pack year of smoking is smoking an average of 1 pack of cigarettes a day for 1 year (for example: 1 pack a day for 30 years or 2 packs a day for 15 years). Yearly screening should continue until the smoker has stopped smoking for at least 15 years. Yearly screening should also be stopped for people who develop a health problem that would prevent them from having lung cancer treatment.  If you are pregnant, do not drink alcohol. If you are breastfeeding, be very cautious about drinking alcohol. If you are not pregnant and choose to drink alcohol, do not exceed 1 drink per day. One drink is considered to be 12 ounces (355 mL) of beer, 5 ounces (148 mL) of wine, or 1.5 ounces (44 mL) of liquor.  Avoid use of street drugs. Do not share needles with anyone. Ask for help if you need support or instructions about stopping  the use of drugs.  High blood pressure causes heart disease and increases the risk of stroke. Blood pressure should be checked at least every 1 to 2 years. Ongoing high blood pressure should be treated with medicines, if weight loss and exercise are not effective.  If you are 59 to 44 years old, ask your caregiver if you should take aspirin to prevent strokes.  Diabetes screening involves taking a blood sample to check your fasting blood sugar level. This should be done once every 3  years, after age 91, if you are within normal weight and without risk factors for diabetes. Testing should be considered at a younger age or be carried out more frequently if you are overweight and have at least 1 risk factor for diabetes.  Breast cancer screening is essential preventative care for women. You should practice "breast self-awareness." This means understanding the normal appearance and feel of your breasts and may include breast self-examination. Any changes detected, no matter how small, should be reported to a caregiver. Women in their 66s and 30s should have a clinical breast exam (CBE) by a caregiver as part of a regular health exam every 1 to 3 years. After age 101, women should have a CBE every year. Starting at age 100, women should consider having a mammogram (breast X-ray) every year. Women who have a family history of breast cancer should talk to their caregiver about genetic screening. Women at a high risk of breast cancer should talk to their caregiver about having an MRI and a mammogram every year.  Breast cancer gene (BRCA)-related cancer risk assessment is recommended for women who have family members with BRCA-related cancers. BRCA-related cancers include breast, ovarian, tubal, and peritoneal cancers. Having family members with these cancers may be associated with an increased risk for harmful changes (mutations) in the breast cancer genes BRCA1 and BRCA2. Results of the assessment will determine the need for genetic counseling and BRCA1 and BRCA2 testing.  The Pap test is a screening test for cervical cancer. Women should have a Pap test starting at age 57. Between ages 25 and 35, Pap tests should be repeated every 2 years. Beginning at age 37, you should have a Pap test every 3 years as long as the past 3 Pap tests have been normal. If you had a hysterectomy for a problem that was not cancer or a condition that could lead to cancer, then you no longer need Pap tests. If you are  between ages 50 and 76, and you have had normal Pap tests going back 10 years, you no longer need Pap tests. If you have had past treatment for cervical cancer or a condition that could lead to cancer, you need Pap tests and screening for cancer for at least 20 years after your treatment. If Pap tests have been discontinued, risk factors (such as a new sexual partner) need to be reassessed to determine if screening should be resumed. Some women have medical problems that increase the chance of getting cervical cancer. In these cases, your caregiver may recommend more frequent screening and Pap tests.  The human papillomavirus (HPV) test is an additional test that may be used for cervical cancer screening. The HPV test looks for the virus that can cause the cell changes on the cervix. The cells collected during the Pap test can be tested for HPV. The HPV test could be used to screen women aged 44 years and older, and should be used in women of any age  who have unclear Pap test results. After the age of 55, women should have HPV testing at the same frequency as a Pap test.  Colorectal cancer can be detected and often prevented. Most routine colorectal cancer screening begins at the age of 44 and continues through age 20. However, your caregiver may recommend screening at an earlier age if you have risk factors for colon cancer. On a yearly basis, your caregiver may provide home test kits to check for hidden blood in the stool. Use of a small camera at the end of a tube, to directly examine the colon (sigmoidoscopy or colonoscopy), can detect the earliest forms of colorectal cancer. Talk to your caregiver about this at age 86, when routine screening begins. Direct examination of the colon should be repeated every 5 to 10 years through age 13, unless early forms of pre-cancerous polyps or small growths are found.  Hepatitis C blood testing is recommended for all people born from [birth year] through 1966 and any  individual with known risks for hepatitis C.  Practice safe sex. Use condoms and avoid high-risk sexual practices to reduce the spread of sexually transmitted infections (STIs). Sexually active women aged 36 and younger should be checked for Chlamydia, which is a common sexually transmitted infection. Older women with new or multiple partners should also be tested for Chlamydia. Testing for other STIs is recommended if you are sexually active and at increased risk.  Osteoporosis is a disease in which the bones lose minerals and strength with aging. This can result in serious bone fractures. The risk of osteoporosis can be identified using a bone density scan. Women ages 20 and over and women at risk for fractures or osteoporosis should discuss screening with their caregivers. Ask your caregiver whether you should be taking a calcium supplement or vitamin D to reduce the rate of osteoporosis.  Menopause can be associated with physical symptoms and risks. Hormone replacement therapy is available to decrease symptoms and risks. You should talk to your caregiver about whether hormone replacement therapy is right for you.  Use sunscreen. Apply sunscreen liberally and repeatedly throughout the day. You should seek shade when your shadow is shorter than you. Protect yourself by wearing long sleeves, pants, a wide-brimmed hat, and sunglasses year round, whenever you are outdoors.  Notify your caregiver of new moles or changes in moles, especially if there is a change in shape or color. Also notify your caregiver if a mole is larger than the size of a pencil eraser.  Stay current with your immunizations. Document Released: 03/31/2011 Document Revised: 01/10/2013 Document Reviewed: 03/31/2011 [facility] Patient Information 2014 [employer].

## 2014-06-24 LAB — URINALYSIS W MICROSCOPIC + REFLEX CULTURE
BACTERIA UA: NONE SEEN
Bilirubin Urine: NEGATIVE
Casts: NONE SEEN
Crystals: NONE SEEN
Glucose, UA: NEGATIVE mg/dL
HGB URINE DIPSTICK: NEGATIVE
KETONES UR: NEGATIVE mg/dL
LEUKOCYTES UA: NEGATIVE
NITRITE: NEGATIVE
PH: 5.5 (ref 5.0–8.0)
Protein, ur: NEGATIVE mg/dL
Specific Gravity, Urine: 1.025 (ref 1.005–1.030)
UROBILINOGEN UA: 0.2 mg/dL (ref 0.0–1.0)

## 2014-06-27 ENCOUNTER — Encounter: Payer: Self-pay | Admitting: Gynecology

## 2014-06-27 LAB — CYTOLOGY - PAP

## 2014-06-28 ENCOUNTER — Encounter: Payer: Self-pay | Admitting: Gynecology

## 2014-07-03 ENCOUNTER — Encounter: Payer: Self-pay | Admitting: Gynecology

## 2014-07-14 ENCOUNTER — Other Ambulatory Visit: Payer: Self-pay

## 2014-07-31 ENCOUNTER — Encounter: Payer: Self-pay | Admitting: Gynecology

## 2016-01-07 ENCOUNTER — Encounter: Payer: Self-pay | Admitting: *Deleted

## 2016-01-07 ENCOUNTER — Emergency Department (INDEPENDENT_AMBULATORY_CARE_PROVIDER_SITE_OTHER)
Admission: EM | Admit: 2016-01-07 | Discharge: 2016-01-07 | Disposition: A | Discharge status: Home / Self Care | Payer: 59 | Source: Home / Self Care | Attending: Family Medicine | Admitting: Family Medicine

## 2016-01-07 ENCOUNTER — Emergency Department (INDEPENDENT_AMBULATORY_CARE_PROVIDER_SITE_OTHER): Payer: 59

## 2016-01-07 DIAGNOSIS — N949 Unspecified condition associated with female genital organs and menstrual cycle: Secondary | ICD-10-CM

## 2016-01-07 DIAGNOSIS — R109 Unspecified abdominal pain: Secondary | ICD-10-CM

## 2016-01-07 DIAGNOSIS — R1032 Left lower quadrant pain: Secondary | ICD-10-CM | POA: Diagnosis not present

## 2016-01-07 DIAGNOSIS — R102 Pelvic and perineal pain: Secondary | ICD-10-CM

## 2016-01-07 LAB — POCT CBC W AUTO DIFF (K'VILLE URGENT CARE)

## 2016-01-07 LAB — POCT URINALYSIS DIP (MANUAL ENTRY)
Bilirubin, UA: NEGATIVE
GLUCOSE UA: NEGATIVE
Ketones, POC UA: NEGATIVE
NITRITE UA: NEGATIVE
SPEC GRAV UA: 1.02 (ref 1.005–1.03)
UROBILINOGEN UA: 0.2 (ref 0–1)
pH, UA: 5.5 (ref 5–8)

## 2016-01-07 LAB — POCT URINE PREGNANCY: Preg Test, Ur: NEGATIVE

## 2016-01-07 NOTE — Discharge Instructions (Signed)
Take Ibuprofen 200mg , 4 tabs every 8 hours with food.  May add Tylenol if needed for pain.  Rest.  Increase fluid intake. If symptoms become significantly worse during the night or over the weekend, proceed to the local emergency room.

## 2016-01-07 NOTE — ED Notes (Addendum)
Pt c/o severe LLQ abdominal pain that started yesterday. Denies N/V/D fever or aches. Taken IBF. Current Menses.

## 2016-01-07 NOTE — ED Provider Notes (Signed)
CSN: [id number]     Arrival date & time 01/07/16  0810 History   First MD Initiated Contact with Patient 01/07/16 [phone]     Chief Complaint  Patient presents with  . Abdominal Pain      HPI Comments: Patient reports that she developed normal mild pelvic premenstrual symptoms several days ago.  Yesterday at 3pm she developed sudden sharp left lower quadrant pelvic pain that lasted about 2 hours.  The pain did not radiate, but she noticed vague diffuse mild abdominal discomfort.  The pain then decreased to a constant dull ache in her left pelvic area.  Her menses started today.  She had mild nausea without vomiting.  No fevers, chills, and sweats.  No urinary symptoms.  No vaginal discharge.  Her pain increases with movement or cough. Past history of cholecystectomy, and C-section  Patient is a 45 y.o. female presenting with abdominal pain. The history is provided by the patient and the spouse.  Abdominal Pain Pain location:  LLQ Pain quality: sharp   Pain radiates to:  Does not radiate Pain severity:  Severe Onset quality:  Sudden Timing:  Constant Progression:  Improving Chronicity:  New Context: previous surgery   Context: not awakening from sleep, not diet changes, not recent illness, not recent travel, not sick contacts, not suspicious food intake and not trauma   Context comment:  Menses started today Relieved by:  Nothing Worsened by:  Movement and coughing Ineffective treatments:  NSAIDs Associated symptoms: nausea and vaginal bleeding   Associated symptoms: no anorexia, no chest pain, no chills, no constipation, no cough, no diarrhea, no dysuria, no fatigue, no fever, no flatus, no hematemesis, no hematochezia, no hematuria, no melena, no shortness of breath, no sore throat, no vaginal discharge and no vomiting   Risk factors: obesity   Risk factors: not pregnant     Past Medical History  Diagnosis Date  . Hyperlipemia    Past Surgical History  Procedure Laterality Date  .  Cholecystectomy    . Knee surgery      Arthroscopic  . Cesarean section     Family History  Problem Relation Age of Onset  . Diabetes Mother   . Hypertension Mother   . Hyperlipidemia Mother   . Diabetes Father   . Hyperlipidemia Father   . Hypertension Father   . Hypertension Sister   . Breast cancer Sister 85  . Hypertension Brother    Social History  Substance Use Topics  . Smoking status: Never Smoker   . Smokeless tobacco: None  . Alcohol Use: No   OB History    Gravida Para Term Preterm AB TAB SAB Ectopic Multiple Living   Review of Systems  Constitutional: Negative for fever, chills and fatigue.  HENT: Negative for sore throat.   Respiratory: Negative for cough and shortness of breath.   Cardiovascular: Negative for chest pain.  Gastrointestinal: Positive for nausea and abdominal pain. Negative for vomiting, diarrhea, constipation, melena, hematochezia, anorexia, flatus and hematemesis.  Genitourinary: Positive for vaginal bleeding. Negative for dysuria, hematuria and vaginal discharge.  All other systems reviewed and are negative.   Allergies  Dilaudid and Sudafed  Home Medications   Prior to Admission medications   Medication Sig Start Date End Date Taking? Authorizing Provider  IBUPROFEN PO Take by mouth.   Yes Historical Provider, MD  loratadine (CLARITIN) 10 MG tablet Take 10 mg by mouth daily.  Yes Historical Provider, MD   Meds Ordered and Administered this Visit  Medications - No data to display  BP 114/79 mmHg  Pulse 88  Temp(Src) 98.3 F (36.8 C) (Oral)  Wt 218 lb (98.884 kg)  SpO2 97%  LMP 01/07/2016 No data found.   Physical Exam  Constitutional: She is oriented to person, place, and time. She appears well-developed and well-nourished. No distress.  HENT:  Head: Normocephalic.  Nose: Nose normal.  Mouth/Throat: Oropharynx is clear and moist.  Eyes: Conjunctivae are normal. Pupils are equal, round, and reactive  to light.  Neck: Neck supple.  Cardiovascular: Normal heart sounds.   Pulmonary/Chest: Breath sounds normal.  Abdominal: Soft. Normal appearance and bowel sounds are normal. She exhibits no distension and no mass. There is no hepatosplenomegaly. There is tenderness in the left lower quadrant. There is guarding. There is no rigidity, no rebound, no CVA tenderness, no tenderness at McBurney's point and negative Murphy's sign. No hernia.    Musculoskeletal: She exhibits no edema.  Lymphadenopathy:    She has no cervical adenopathy.  Neurological: She is alert and oriented to person, place, and time.  Skin: Skin is warm and dry. No rash noted.  Nursing note and vitals reviewed.   ED Course  Procedures none    Labs Reviewed  POCT URINALYSIS DIP (MANUAL ENTRY) - Abnormal; Notable for the following:    Color, UA red (*)    Blood, UA large (*)    Protein Ur, POC =100 (*)    Leukocytes, UA Trace (*)    All other components within normal limits  POCT CBC W AUTO DIFF ([facility]):  WBC 8.1; LY 24.4; MO 7.8; GR 67.8; Hgb 13.9; Platelets 267   POCT URINE PREGNANCY negative    Imaging Review [place] Transvaginal Non-ob  01/07/2016  CLINICAL DATA:  Left lower quadrant pain/tenderness for 1 day. Prior Cesarean section. EXAM: TRANSABDOMINAL AND TRANSVAGINAL ULTRASOUND OF PELVIS TECHNIQUE: Both transabdominal and transvaginal ultrasound examinations of the pelvis were performed. Transabdominal technique was performed for global imaging of the pelvis including uterus, ovaries, adnexal regions, and pelvic cul-de-sac. It was necessary to proceed with endovaginal exam following the transabdominal exam to visualize the endometrium and ovaries. COMPARISON:  None FINDINGS: Uterus Measurements: 10.8 x 5.2 x 7.0 cm. No fibroids or other mass visualized. Endometrium Thickness: 4 mm.  No focal abnormality visualized. Right ovary Not identified due to bowel gas. Left ovary Measurements: 3.3 x 2.1 x 2.2 cm.  Normal appearance/no adnexal mass. Other findings No abnormal free fluid. IMPRESSION: 1. Unremarkable appearance of the uterus and left ovary. 2. Right ovary obscured by bowel. Electronically Signed   By: Sebastian AcheAllen  Grady M.D.   On: 01/07/2016 10:18   [place] Pelvis Complete  01/07/2016  CLINICAL DATA:  Left lower quadrant pain/tenderness for 1 day. Prior Cesarean section. EXAM: TRANSABDOMINAL AND TRANSVAGINAL ULTRASOUND OF PELVIS TECHNIQUE: Both transabdominal and transvaginal ultrasound examinations of the pelvis were performed. Transabdominal technique was performed for global imaging of the pelvis including uterus, ovaries, adnexal regions, and pelvic cul-de-sac. It was necessary to proceed with endovaginal exam following the transabdominal exam to visualize the endometrium and ovaries. COMPARISON:  None FINDINGS: Uterus Measurements: 10.8 x 5.2 x 7.0 cm. No fibroids or other mass visualized. Endometrium Thickness: 4 mm.  No focal abnormality visualized. Right ovary Not identified due to bowel gas. Left ovary Measurements: 3.3 x 2.1 x 2.2 cm. Normal appearance/no adnexal mass. Other findings No abnormal free fluid. IMPRESSION: 1. Unremarkable appearance  of the uterus and left ovary. 2. Right ovary obscured by bowel. Electronically Signed   By: Sebastian Ache M.D.   On: 01/07/2016 10:18      MDM   1. Acute pelvic pain, female ?etiology.  Normal white blood count and negative pregnancy test reassuring.   2. Abdominal pain     Treat symptomatically for now  Take Ibuprofen , 4 tabs every 8 hours with food.  May add Tylenol if needed for pain.  Rest.  Increase fluid intake. If symptoms become significantly worse during the night or over the weekend, proceed to the local emergency room.  Followup with Family Doctor if not improved in about 4 days.    Lattie Haw, MD 01/07/16 1046

## 2016-01-10 ENCOUNTER — Telehealth: Payer: Self-pay | Admitting: Emergency Medicine

## 2017-02-11 ENCOUNTER — Encounter: Payer: Self-pay | Admitting: Gynecology

## 2020-02-14 ENCOUNTER — Ambulatory Visit: Payer: Self-pay | Attending: Internal Medicine

## 2020-02-14 DIAGNOSIS — Z23 Encounter for immunization: Secondary | ICD-10-CM

## 2020-02-14 NOTE — Progress Notes (Signed)
   Covid-19 Vaccination Clinic  Name:  [name]    MRN: [id number] DOB: June 15, 1971  02/14/2020  [name] was observed post Covid-19 immunization for 15 minutes without incident. She was provided with Vaccine Information Sheet and instruction to access the V-Safe system.   [name] was instructed to call 911 with any severe reactions post vaccine: Marland Kitchen Difficulty breathing  . Swelling of face and throat  . A fast heartbeat  . A bad rash all over body  . Dizziness and weakness   Immunizations Administered    Name Date Dose VIS Date Route   Pfizer COVID-19 Vaccine 02/14/2020  4:57 PM 0.3 mL 11/23/2018 Intramuscular   Manufacturer: [employer], [employer]   Lot: [id number]   NDC: [address]-5

## 2020-03-06 ENCOUNTER — Ambulatory Visit: Payer: Self-pay | Attending: Internal Medicine

## 2020-03-06 DIAGNOSIS — Z23 Encounter for immunization: Secondary | ICD-10-CM

## 2020-03-06 NOTE — Progress Notes (Signed)
   Covid-19 Vaccination Clinic  Name:  DON GIARRUSSO    MRN: [id number] DOB: [birth year]  03/06/2020  Ms. Delahoz was observed post Covid-19 immunization for 15 minutes without incident. She was provided with Vaccine Information Sheet and instruction to access the V-Safe system.   Ms. Lagace was instructed to call 911 with any severe reactions post vaccine: Marland Kitchen Difficulty breathing  . Swelling of face and throat  . A fast heartbeat  . A bad rash all over body  . Dizziness and weakness   Immunizations Administered    Name Date Dose VIS Date Route   Pfizer COVID-19 Vaccine 03/06/2020  4:39 PM 0.3 mL 11/23/2018 Intramuscular   Manufacturer: [employer], [employer]   Lot: [id number]   NDC: [address]-5

## 2021-04-03 ENCOUNTER — Emergency Department (HOSPITAL_BASED_OUTPATIENT_CLINIC_OR_DEPARTMENT_OTHER): Payer: Managed Care, Other (non HMO)

## 2021-04-03 ENCOUNTER — Emergency Department (HOSPITAL_BASED_OUTPATIENT_CLINIC_OR_DEPARTMENT_OTHER)
Admission: EM | Admit: 2021-04-03 | Discharge: 2021-04-03 | Disposition: A | Discharge status: Home / Self Care | Payer: Managed Care, Other (non HMO) | Attending: Emergency Medicine | Admitting: Emergency Medicine

## 2021-04-03 ENCOUNTER — Other Ambulatory Visit: Payer: Self-pay

## 2021-04-03 ENCOUNTER — Encounter (HOSPITAL_BASED_OUTPATIENT_CLINIC_OR_DEPARTMENT_OTHER): Payer: Self-pay | Admitting: *Deleted

## 2021-04-03 DIAGNOSIS — U071 COVID-19: Secondary | ICD-10-CM | POA: Insufficient documentation

## 2021-04-03 DIAGNOSIS — R519 Headache, unspecified: Secondary | ICD-10-CM | POA: Diagnosis present

## 2021-04-03 LAB — RESP PANEL BY RT-PCR (FLU A&B, COVID) ARPGX2
Influenza A by PCR: NEGATIVE
Influenza B by PCR: NEGATIVE
SARS Coronavirus 2 by RT PCR: POSITIVE — AB

## 2021-04-03 MED ORDER — NIRMATRELVIR/RITONAVIR (PAXLOVID)TABLET: 3.0000 | ORAL_TABLET | Freq: Two times a day (BID) | ORAL | 0 refills | Status: AC

## 2021-04-03 NOTE — ED Provider Notes (Signed)
[facility] EMERGENCY DEPARTMENT Provider Note   CSN: [id number] Arrival date & time: 04/03/21  1726     History Chief Complaint  Patient presents with   Covid Positive    [name] is a 50 y.o. female who presents on day 3 of symptoms with positive home COVID test yesterday.  Was directed to the emergency department for "evaluation of my lungs" per her PCP.  Endorses body aches and headaches, but denies cough, shortness of breath, palpitations.  Denies nausea , vomiting, diarrhea, urinary symptoms.  Patient was vaccinated and post today with COVID-19.  I have personally reviewed the patient's medical records.  She is history of hyperlipidemia and osteoarthritis.  Otherwise carries no medical diagnoses and is not on any medications every day.  HPI     Past Medical History:  Diagnosis Date   Hyperlipemia     Patient Active Problem List   Diagnosis Date Noted   Right ankle pain 06/30/2013   Osteoarthritis of right knee 06/02/2013    Past Surgical History:  Procedure Laterality Date   CESAREAN SECTION     CHOLECYSTECTOMY     KNEE SURGERY     Arthroscopic     OB History     Gravida  4   Para  2   Term      Preterm      AB  2   Living  2      SAB  2   IAB      Ectopic      Multiple      Live Births              Family History  Problem Relation Age of Onset   Diabetes Mother    Hypertension Mother    Hyperlipidemia Mother    Diabetes Father    Hyperlipidemia Father    Hypertension Father    Hypertension Sister    Breast cancer Sister 72   Hypertension Brother     Social History   Tobacco Use   Smoking status: Never  Substance Use Topics   Alcohol use: No   Drug use: No    Home Medications Prior to Admission medications   Medication Sig Start Date End Date Taking? Authorizing Provider  nirmatrelvir/ritonavir EUA (PAXLOVID) TABS Take 3 tablets by mouth 2 (two) times daily for 5 days. Patient GFR is >60. Take  nirmatrelvir (150 mg) two tablets twice daily for 5 days and ritonavir (100 mg) one tablet twice daily for 5 days. 04/03/21 04/08/21 Yes Sherese Heyward R, PA-C  IBUPROFEN PO Take by mouth.    [provider]  loratadine (CLARITIN) 10 MG tablet Take 10 mg by mouth daily.    [provider]    Allergies    Dilaudid [hydromorphone hcl] and Sudafed [pseudoephedrine]  Review of Systems   Review of Systems  Constitutional:  Positive for appetite change, chills and fatigue. Negative for activity change, diaphoresis and fever.  HENT: Negative.    Eyes:  Negative for photophobia and visual disturbance.  Respiratory: Negative.    Cardiovascular: Negative.   Gastrointestinal: Negative.   Musculoskeletal:  Positive for arthralgias and myalgias. Negative for neck pain and neck stiffness.  Skin: Negative.   Neurological:  Positive for headaches. Negative for dizziness, syncope, weakness and light-headedness.   Physical Exam Updated Vital Signs BP (!) 149/110   Pulse 89   Temp 98.6 F (37 C) (Oral)   Resp 16   Ht 5\' 7"  (1.702  m)   Wt 94.8 kg   LMP 03/13/2021   SpO2 98%   BMI 32.73 kg/m   Physical Exam Vitals and nursing note reviewed.  Constitutional:      Appearance: She is obese. She is not ill-appearing.  HENT:     Head: Normocephalic and atraumatic.     Nose: Nose normal.     Mouth/Throat:     Mouth: Mucous membranes are moist.     Pharynx: Oropharynx is clear. Uvula midline. No oropharyngeal exudate, posterior oropharyngeal erythema or uvula swelling.     Tonsils: No tonsillar exudate.  Eyes:     General: Lids are normal. Vision grossly intact.        Right eye: No discharge.        Left eye: No discharge.     Extraocular Movements: Extraocular movements intact.     Conjunctiva/sclera: Conjunctivae normal.     Pupils: Pupils are equal, round, and reactive to light.  Neck:     Trachea: Trachea and phonation normal.     Meningeal: Brudzinski's sign and  Kernig's sign absent.  Cardiovascular:     Rate and Rhythm: Normal rate and regular rhythm.     Pulses: Normal pulses.     Heart sounds: Normal heart sounds. No murmur heard. Pulmonary:     Effort: Pulmonary effort is normal. No tachypnea, bradypnea, accessory muscle usage, prolonged expiration or respiratory distress.     Breath sounds: Normal breath sounds. No decreased air movement. No decreased breath sounds, wheezing or rales.  Chest:     Chest wall: No mass, lacerations, deformity, swelling, tenderness, crepitus or edema.  Abdominal:     General: Bowel sounds are normal. There is no distension.     Palpations: Abdomen is soft.     Tenderness: There is no abdominal tenderness. There is no guarding.  Musculoskeletal:        General: No deformity.     Cervical back: Normal range of motion and neck supple. No edema, rigidity or crepitus. No pain with movement, spinous process tenderness or muscular tenderness.     Right lower leg: No edema.     Left lower leg: No edema.  Skin:    General: Skin is warm and dry.     Capillary Refill: Capillary refill takes less than 2 seconds.  Neurological:     General: No focal deficit present.     Mental Status: She is alert and oriented to person, place, and time. Mental status is at baseline.     Sensory: Sensation is intact.     Motor: Motor function is intact.     Coordination: Coordination is intact.     Gait: Gait is intact.  Psychiatric:        Mood and Affect: Mood normal.    ED Results / Procedures / Treatments   Labs (all labs ordered are listed, but only abnormal results are displayed) Labs Reviewed  RESP PANEL BY RT-PCR (FLU A&B, COVID) ARPGX2 - Abnormal; Notable for the following components:      Result Value   SARS Coronavirus 2 by RT PCR POSITIVE (*)    All other components within normal limits    EKG EKG Interpretation  Date/Time:  Wednesday April 03 2021 18:19:47 EDT Ventricular Rate:  89 PR Interval:  140 QRS  Duration: 76 QT Interval:  344 QTC Calculation: 418 R Axis:   63 Text Interpretation: Normal sinus rhythm Low voltage QRS Nonspecific ST and T wave abnormality Abnormal ECG No  old tracing to compare Confirmed by Meridee Score [phone]) on 04/03/2021 6:21:30 PM  Radiology DG Chest 2 View  Result Date: 04/03/2021 CLINICAL DATA:  Chest pain EXAM: CHEST - 2 VIEW COMPARISON:  None. FINDINGS: Mild elevation right diaphragm. No focal opacity or pleural effusion. Normal cardiac size. No pneumothorax. IMPRESSION: No active cardiopulmonary disease. Electronically Signed   By: Jasmine Pang M.D.   On: 04/03/2021 18:53    Procedures Procedures   Medications Ordered in ED Medications - No data to display  ED Course  I have reviewed the triage vital signs and the nursing notes.  Pertinent labs & imaging results that were available during my care of the patient were reviewed by me and considered in my medical decision making (see chart for details).    MDM Rules/Calculators/A&P                         50 year old female presents with known COVID-19 infection concern for myalgias and "my lungs checked".  COVID-19 infection confirmed by PCR in the ED today.  Hypertensive on intake, vital signs otherwise normal.  Cardiopulmonary exam is normal, abdominal exam is benign.  EGD exam without focal finding.  Patient is ambulatory in triage and well-appearing.  Chest x-ray negative for acute cardiopulmonary disease, EKG reassuring with normal sinus rhythm no STEMI.  No further work-up warranted at this time.  Recommend symptom management at home.  Extensive discussion regarding role of antiviral medication given patient is on day 3 of symptoms.  She voiced understanding of the risks versus benefits of proceeding with antiviral therapy and expressed wishes to proceed with oral antivirals at home.  Paxlovid prescription placed.  She does not have any contraindications to this medication.  Normal GFR in April of this  year.  No further work-up warranted in the ED at this time.  Takina voiced understanding of her medical evaluation and treatment plan.  Each of her questions was answered to her expressed satisfaction.  Return precautions are given.  Patient is stable and appropriate for discharge at this time.  This chart was dictated using voice recognition software, Dragon. Despite the best efforts of this provider to proofread and correct errors, errors may still occur which can change documentation meaning.  SKILYNN DURNEY was evaluated in Emergency Department on 04/03/2021 for the symptoms described in the history of present illness. She was evaluated in the context of the global COVID-19 pandemic, which necessitated consideration that the patient might be at risk for infection with the SARS-CoV-2 virus that causes COVID-19. Institutional protocols and algorithms that pertain to the evaluation of patients at risk for COVID-19 are in a state of rapid change based on information released by regulatory bodies including the CDC and federal and state organizations. These policies and algorithms were followed during the patient's care in the ED.  Final Clinical Impression(s) / ED Diagnoses Final diagnoses:  COVID-19    Rx / DC Orders ED Discharge Orders          Ordered    nirmatrelvir/ritonavir EUA (PAXLOVID) TABS  2 times daily        04/03/21 [address] Eugene Gavia, PA-C 04/03/21 1930    Terrilee Files, MD 04/04/21 1046

## 2021-04-03 NOTE — ED Triage Notes (Signed)
Covid + home test x 2 days ago, fever, cough , back pain and chest pain .

## 2021-04-03 NOTE — ED Notes (Signed)
+   covid at home on h ome test, cp since Monday.  Reports it work her up.  PCP sent for ekg and chest xray to evaluate lungs.

## 2021-04-03 NOTE — Discharge Instructions (Addendum)
You were seen in the ER today for your COVID-19 symptoms.  Your COVID-19 infection was confirmed here, however your chest x-ray and EKG were very reassuring.  There is no sign of heart attack on your EKG and there is no sign of infection in your lungs.  Your physical exam and vital signs were also very reassuring.  You have been prescribed an antiviral medication combination called Paxlovid to take for the next 5 days.  This may shorten the course of your COVID-19 infection that will not help with your symptoms.  You may continue to alternate Tylenol ibuprofen at home as needed.  Follow-up with your primary care doctor and return to the ER if you develop any worsening shortness of breath, palpitations, chest pain, nausea or vomiting does not stop, or any other new severe symptoms.

## 2021-08-29 IMAGING — DX DG CHEST 2V
2 series · 2 of 2 positions shown · non-contrast
Comparison: None.

CLINICAL DATA: Chest pain

EXAM:
CHEST - 2 VIEW

[chest pa]
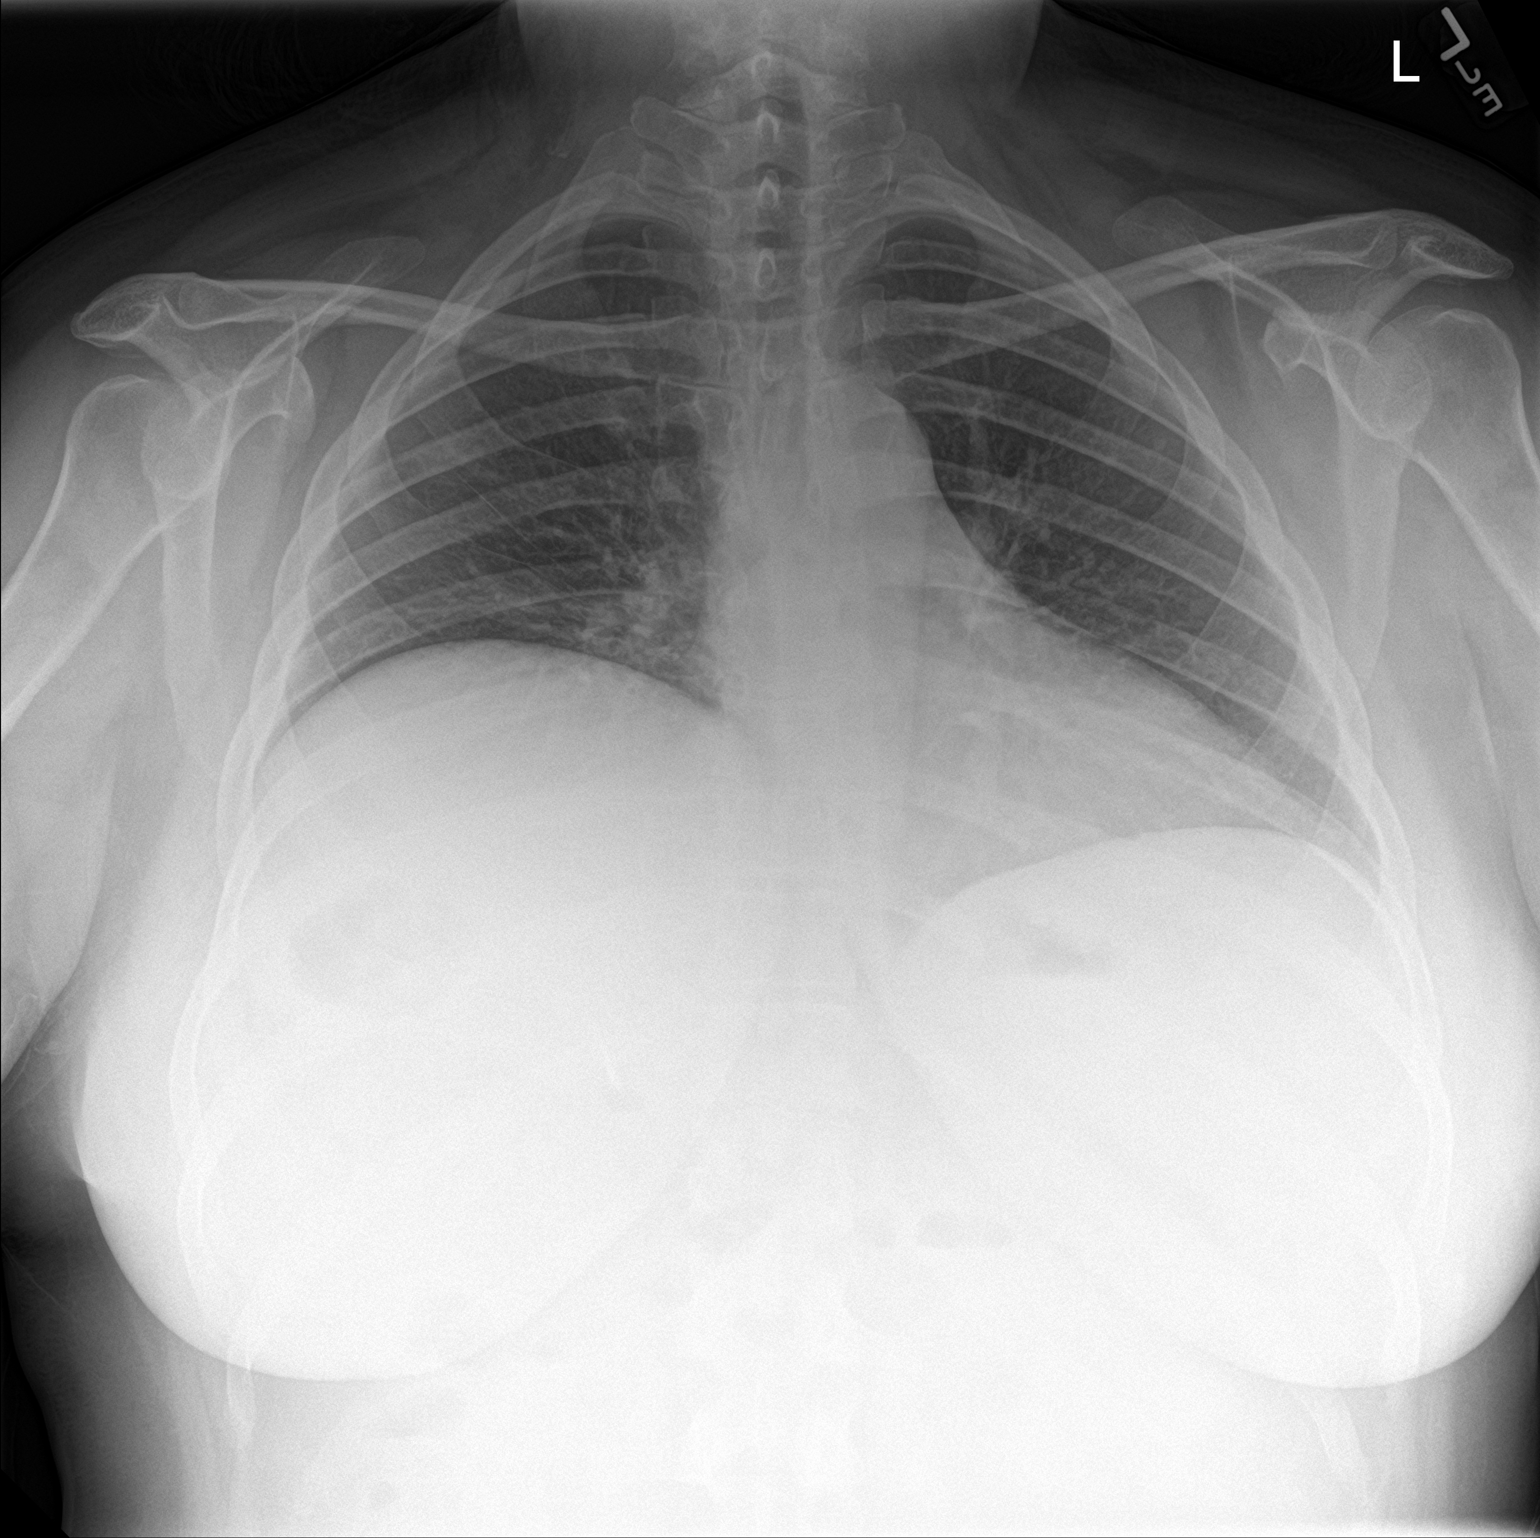

[chest lat]
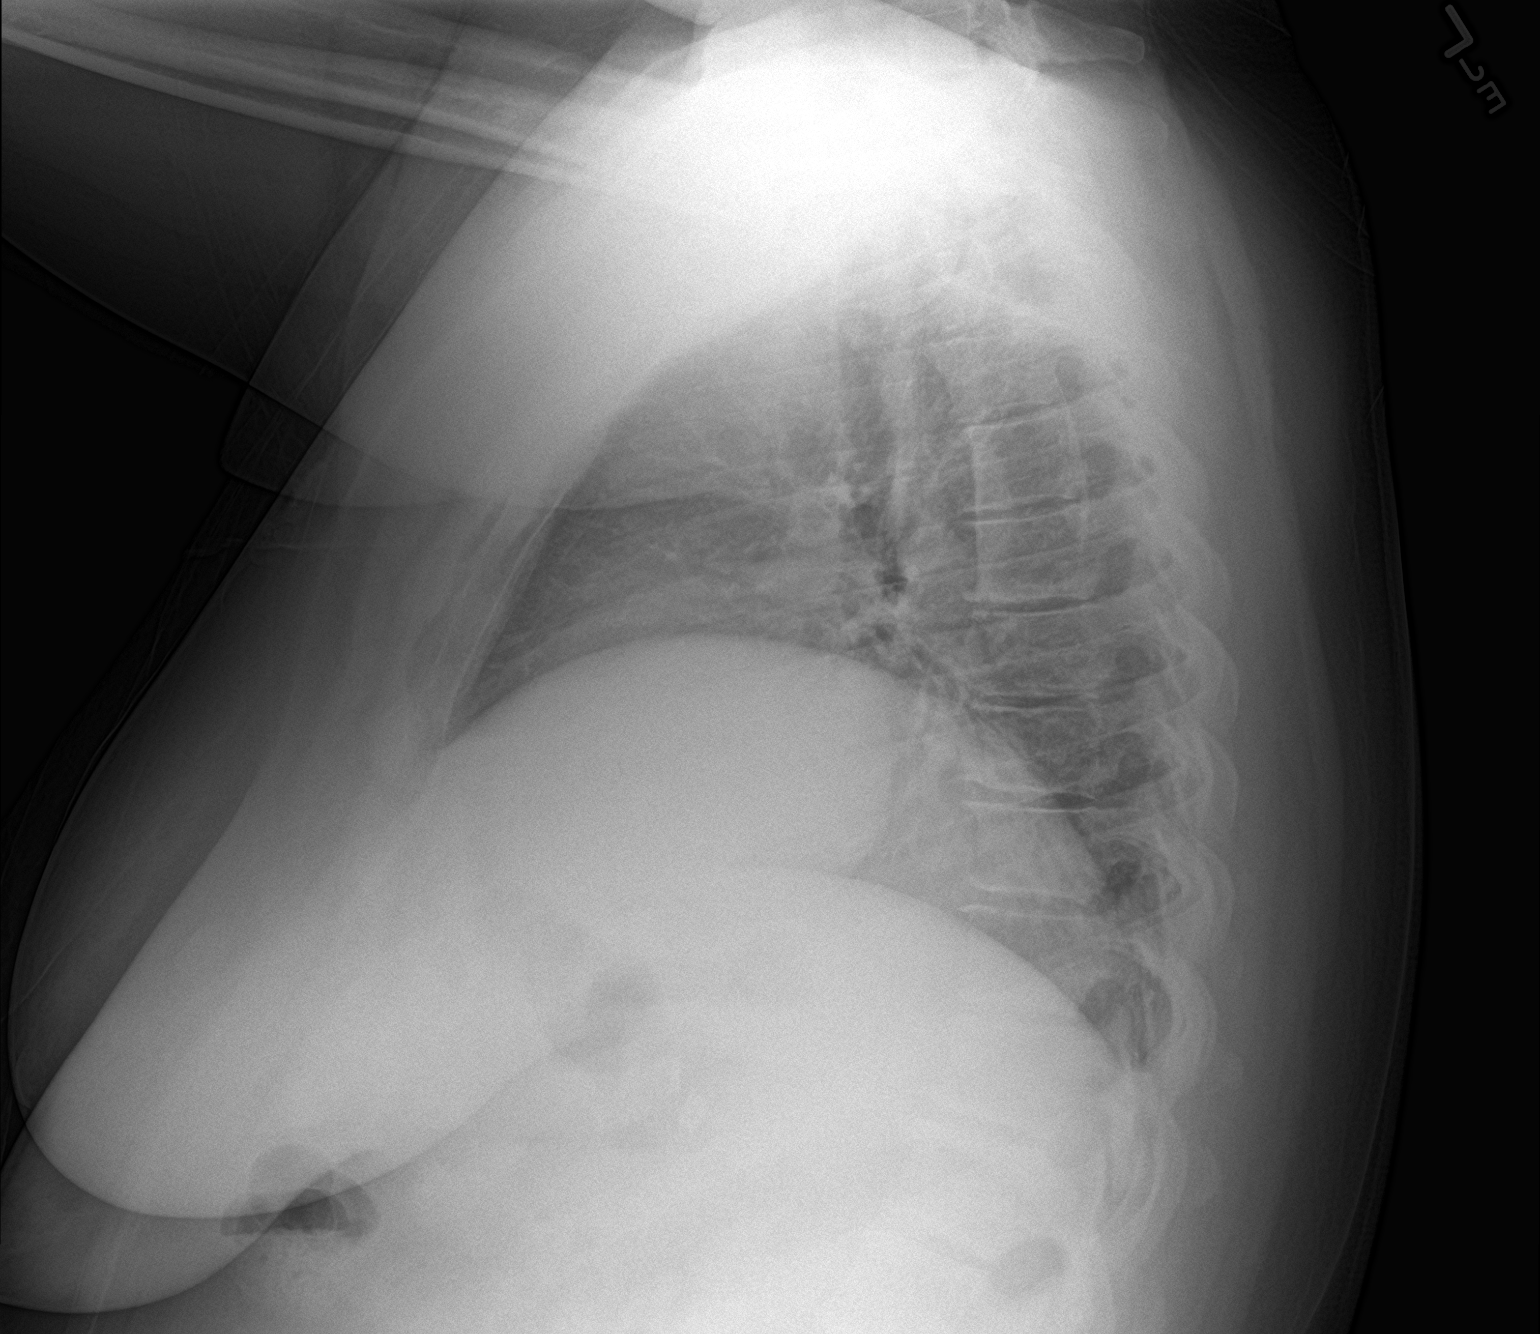

[2 of 2 positions shown; findings below may reference images not displayed]

FINDINGS: Mild elevation right diaphragm. No focal opacity or pleural
effusion. Normal cardiac size. No pneumothorax.
IMPRESSION: No active cardiopulmonary disease.

## 2022-07-27 ENCOUNTER — Encounter: Payer: Self-pay | Admitting: Emergency Medicine

## 2022-07-27 ENCOUNTER — Ambulatory Visit (INDEPENDENT_AMBULATORY_CARE_PROVIDER_SITE_OTHER): Payer: 59

## 2022-07-27 ENCOUNTER — Ambulatory Visit
Admission: EM | Admit: 2022-07-27 | Discharge: 2022-07-27 | Disposition: A | Discharge status: Home / Self Care | Payer: 59 | Attending: Family Medicine | Admitting: Family Medicine

## 2022-07-27 DIAGNOSIS — W19XXXA Unspecified fall, initial encounter: Secondary | ICD-10-CM

## 2022-07-27 DIAGNOSIS — M25521 Pain in right elbow: Secondary | ICD-10-CM

## 2022-07-27 DIAGNOSIS — S50311A Abrasion of right elbow, initial encounter: Secondary | ICD-10-CM | POA: Diagnosis not present

## 2022-07-27 DIAGNOSIS — M25551 Pain in right hip: Secondary | ICD-10-CM | POA: Diagnosis not present

## 2022-07-27 MED ORDER — CEPHALEXIN 500 MG PO CAPS: 500.0000 mg | ORAL_CAPSULE | Freq: Two times a day (BID) | ORAL | 0 refills | Status: AC

## 2022-07-27 MED ORDER — MUPIROCIN 2 % EX OINT: 1.0000 | TOPICAL_OINTMENT | Freq: Two times a day (BID) | CUTANEOUS | 0 refills | Status: AC

## 2022-07-27 NOTE — Discharge Instructions (Addendum)
Wash area and apply Bactroban ointment, nonstick dressing 2 times a day Take antibiotic 2 times a day for 5 days Use ice to reduce pain and swelling Take ibuprofen up to 800 mg 3 times a day as needed for pain Return as needed

## 2022-07-27 NOTE — ED Provider Notes (Signed)
[facility]    CSN: [id number] Arrival date & time: 07/27/22  0820      History   Chief Complaint Chief Complaint  Patient presents with   Fall    HPI ANNALIESE SAEZ is a 51 y.o. female.   HPI  Patient fell on uneven ground yesterday.  Larey Seat towards her right side.  Has an abrasion and pain in her right forearm and elbow.  Tylenol as some pain in her right hip when she lays on that side and a small abrasion on her knee.  Can weight-bear and walk without limp.  Concern for swelling and wound on elbow area.  Did not hit head.  No loss of consciousness. Tetanus was October 2022  Past Medical History:  Diagnosis Date   Hyperlipemia     Patient Active Problem List   Diagnosis Date Noted   Right ankle pain 06/30/2013   Osteoarthritis of right knee 06/02/2013    Past Surgical History:  Procedure Laterality Date   CESAREAN SECTION     CHOLECYSTECTOMY     KNEE SURGERY     Arthroscopic    OB History     Gravida  4   Para  2   Term      Preterm      AB  2   Living  2      SAB  2   IAB      Ectopic      Multiple      Live Births               Home Medications    Prior to Admission medications   Medication Sig Start Date End Date Taking? Authorizing Provider  cephALEXin (KEFLEX) 500 MG capsule Take 1 capsule (500 mg total) by mouth 2 (two) times daily. 07/27/22  Yes Eustace Moore, MD  mupirocin ointment (BACTROBAN) 2 % Apply 1 Application topically 2 (two) times daily. 07/27/22  Yes Eustace Moore, MD  IBUPROFEN PO Take by mouth. Patient not taking: Reported on 07/27/2022    [provider]  loratadine (CLARITIN) 10 MG tablet Take 10 mg by mouth daily.    [provider]    Family History Family History  Problem Relation Age of Onset   Diabetes Mother    Hypertension Mother    Hyperlipidemia Mother    Diabetes Father    Hyperlipidemia Father    Hypertension Father    Hypertension Sister    Breast  cancer Sister 6   Hypertension Brother     Social History Social History   Tobacco Use   Smoking status: Never    Passive exposure: Never   Smokeless tobacco: Never  Substance Use Topics   Alcohol use: No   Drug use: No     Allergies   Sudafed [pseudoephedrine] and Dilaudid [hydromorphone hcl]   Review of Systems Review of Systems See HPI  Physical Exam Triage Vital Signs ED Triage Vitals  Enc Vitals Group     BP 07/27/22 0837 131/88     Pulse Rate 07/27/22 0837 75     Resp 07/27/22 0837 16     Temp 07/27/22 0837 98.6 F (37 C)     Temp Source 07/27/22 0837 Oral     SpO2 07/27/22 0837 97 %     Weight 07/27/22 0839 212 lb (96.2 kg)     Height 07/27/22 0839 5\' 7"  (1.702 m)     Head Circumference --  Peak Flow --      Pain Score 07/27/22 0838 5     Pain Loc --      Pain Edu? --      Excl. in [place]? --    No data found.  Updated Vital Signs BP 131/88 (BP Location: Left Arm)   Pulse 75   Temp 98.6 F (37 C) (Oral)   Resp 16   Ht 5\' 7"  (1.702 m)   Wt 96.2 kg   LMP 03/13/2021   SpO2 97%   BMI 33.20 kg/m      Physical Exam Constitutional:      General: She is not in acute distress.    Appearance: She is well-developed.  HENT:     Head: Normocephalic and atraumatic.  Eyes:     Conjunctiva/sclera: Conjunctivae normal.     Pupils: Pupils are equal, round, and reactive to light.  Cardiovascular:     Rate and Rhythm: Normal rate.  Pulmonary:     Effort: Pulmonary effort is normal. No respiratory distress.  Abdominal:     General: There is no distension.     Palpations: Abdomen is soft.  Musculoskeletal:        General: Swelling, tenderness and signs of injury present. Normal range of motion.     Right elbow: Swelling present. Tenderness present.       Arms:     Cervical back: Normal range of motion.     Comments: Tenderness also over greater trochanter to deep palpation.  Small abrasion on the right lateral knee with no bony tenderness or  limited mobility  Skin:    General: Skin is warm and dry.  Neurological:     Mental Status: She is alert.     Gait: Gait normal.  Psychiatric:        Mood and Affect: Mood normal.        Behavior: Behavior normal.      UC Treatments / Results  Labs (all labs ordered are listed, but only abnormal results are displayed) Labs Reviewed - No data to display  EKG   Radiology DG ELBOW COMPLETE RIGHT (3+VIEW)  Result Date: 07/27/2022 CLINICAL DATA:  Fall yesterday.  Elbow pain and abrasion. EXAM: RIGHT ELBOW - COMPLETE 3+ VIEW COMPARISON:  None Available. FINDINGS: No fracture.  No bone lesion. Elbow joint normally spaced and aligned.  No joint effusion. Posterior subcutaneous soft tissue edema along the posterior upper forearm to the elbow. No radiopaque foreign body. IMPRESSION: 1. No fracture or elbow joint abnormality. 2. No radiopaque foreign body. Electronically Signed   By: Lajean Manes M.D.   On: 07/27/2022 09:07    Procedures Procedures (including critical care time)  Medications Ordered in UC Medications - No data to display  Initial Impression / Assessment and Plan / UC Course  I have reviewed the triage vital signs and the nursing notes.  Pertinent labs & imaging results that were available during my care of the patient were reviewed by me and considered in my medical decision making (see chart for details).     Final Clinical Impressions(s) / UC Diagnoses   Final diagnoses:  Abrasion of right elbow, initial encounter  Fall, initial encounter  Acute hip pain, right     Discharge Instructions      Wash area and apply Bactroban ointment, nonstick dressing 2 times a day Take antibiotic 2 times a day for 5 days Use ice to reduce pain and swelling Take ibuprofen up to 800 mg 3  times a day as needed for pain Return as needed   ED Prescriptions     Medication Sig Dispense Auth. Provider   cephALEXin (KEFLEX) 500 MG capsule Take 1 capsule (500 mg total) by  mouth 2 (two) times daily. 10 capsule Eustace Moore, MD   mupirocin ointment (BACTROBAN) 2 % Apply 1 Application topically 2 (two) times daily. 22 g Eustace Moore, MD      PDMP not reviewed this encounter.   Eustace Moore, MD 07/27/22 1110

## 2022-07-27 NOTE — ED Triage Notes (Addendum)
Fell yesterday on uneven pavement  at 1230 Pain to  R elbow  & R hip  Denies LOC or hitting her head  Abrasion to r arm - cleaned w/ water and  bactine, neosporin and telfa dressing Pain with movement to R elbow  Fingers feel number on R hand  Advil 400mg  at 0730 Here with husband

## 2023-07-02 ENCOUNTER — Emergency Department (HOSPITAL_BASED_OUTPATIENT_CLINIC_OR_DEPARTMENT_OTHER)
Admission: EM | Admit: 2023-07-02 | Discharge: 2023-07-03 | Disposition: A | Discharge status: Home / Self Care | Payer: 59 | Attending: Emergency Medicine | Admitting: Emergency Medicine

## 2023-07-02 ENCOUNTER — Emergency Department (HOSPITAL_BASED_OUTPATIENT_CLINIC_OR_DEPARTMENT_OTHER): Payer: 59

## 2023-07-02 ENCOUNTER — Other Ambulatory Visit: Payer: Self-pay

## 2023-07-02 ENCOUNTER — Encounter (HOSPITAL_BASED_OUTPATIENT_CLINIC_OR_DEPARTMENT_OTHER): Payer: Self-pay | Admitting: Emergency Medicine

## 2023-07-02 DIAGNOSIS — R079 Chest pain, unspecified: Secondary | ICD-10-CM | POA: Diagnosis present

## 2023-07-02 DIAGNOSIS — R61 Generalized hyperhidrosis: Secondary | ICD-10-CM | POA: Insufficient documentation

## 2023-07-02 DIAGNOSIS — R0602 Shortness of breath: Secondary | ICD-10-CM | POA: Insufficient documentation

## 2023-07-02 LAB — CBC
HCT: 43.1 % (ref 36.0–46.0)
Hemoglobin: 14.5 g/dL (ref 12.0–15.0)
MCH: 30 pg (ref 26.0–34.0)
MCHC: 33.6 g/dL (ref 30.0–36.0)
MCV: 89 fL (ref 80.0–100.0)
Platelets: 257 10*3/uL (ref 150–400)
RBC: 4.84 MIL/uL (ref 3.87–5.11)
RDW: 12.4 % (ref 11.5–15.5)
WBC: 9.1 10*3/uL (ref 4.0–10.5)
nRBC: 0 % (ref 0.0–0.2)

## 2023-07-02 LAB — BASIC METABOLIC PANEL
Anion gap: 10 (ref 5–15)
BUN: 13 mg/dL (ref 6–20)
CO2: 23 mmol/L (ref 22–32)
Calcium: 8.6 mg/dL — ABNORMAL LOW (ref 8.9–10.3)
Chloride: 103 mmol/L (ref 98–111)
Creatinine, Ser: 0.83 mg/dL (ref 0.44–1.00)
GFR, Estimated: >60 mL/min (ref >60)
Glucose, Bld: 131 mg/dL — ABNORMAL HIGH (ref 70–99)
Potassium: 3.9 mmol/L (ref 3.5–5.1)
Sodium: 136 mmol/L (ref 135–145)

## 2023-07-02 LAB — TROPONIN I (HIGH SENSITIVITY)
Troponin I (High Sensitivity): 2 ng/L (ref <18)
Troponin I (High Sensitivity): 3 ng/L (ref <18)

## 2023-07-02 MED ORDER — FAMOTIDINE IN NACL 20-0.9 MG/50ML-% IV SOLN
20.0000 mg | Freq: Once | INTRAVENOUS | Status: AC
  Administered 2023-07-02: 20 mg via INTRAVENOUS
  Filled 2023-07-02: qty 50

## 2023-07-02 MED ORDER — SODIUM CHLORIDE 0.9 % IV BOLUS
1000.0000 mL | Freq: Once | INTRAVENOUS | Status: AC
  Administered 2023-07-02: 1000 mL via INTRAVENOUS

## 2023-07-02 MED ORDER — ONDANSETRON HCL 4 MG/2ML IJ SOLN
4.0000 mg | Freq: Once | INTRAMUSCULAR | Status: AC
  Administered 2023-07-02: 4 mg via INTRAVENOUS
  Filled 2023-07-02: qty 2

## 2023-07-02 NOTE — ED Provider Notes (Signed)
[facility] Provider Note   CSN: [id number] Arrival date & time: 07/02/23  2020     History  Chief Complaint  Patient presents with   Chest Pain    [name] is a 52 y.o. female otherwise healthy here presenting with chest pain.  Patient states that around dinnertime she went out to eat with her husband.  She said that she had sudden onset of chest pain after she vomited.  She then had an episode of diaphoresis.  She states that the pain radiate down to the left arm.  Patient has some subjective shortness of breath at that time.  Denies any cardiac history.  Denies any history of DVT or PE.  Denies any recent travel.  The history is provided by the patient.       Home Medications Prior to Admission medications   Medication Sig Start Date End Date Taking? Authorizing Provider  cephALEXin (KEFLEX) 500 MG capsule Take 1 capsule (500 mg total) by mouth 2 (two) times daily. 07/27/22   Eustace Moore, MD  IBUPROFEN PO Take by mouth. Patient not taking: Reported on 07/27/2022    [provider]  loratadine (CLARITIN) 10 MG tablet Take 10 mg by mouth daily.    [provider]  mupirocin ointment (BACTROBAN) 2 % Apply 1 Application topically 2 (two) times daily. 07/27/22   Eustace Moore, MD      Allergies    Sudafed [pseudoephedrine] and Dilaudid [hydromorphone hcl]    Review of Systems   Review of Systems  Cardiovascular:  Positive for chest pain.  All other systems reviewed and are negative.   Physical Exam Updated Vital Signs BP 133/88   Pulse 79   Temp 98.1 F (36.7 C) (Oral)   Resp 14   Ht 5\' 7"  (1.702 m)   Wt 99.8 kg   LMP 03/13/2021   SpO2 96%   BMI 34.46 kg/m  Physical Exam Vitals and nursing note reviewed.  HENT:     Head: Normocephalic.  Eyes:     Extraocular Movements: Extraocular movements intact.     Pupils: Pupils are equal, round, and reactive to light.  Cardiovascular:      Rate and Rhythm: Normal rate and regular rhythm.     Heart sounds: Normal heart sounds.  Pulmonary:     Effort: Pulmonary effort is normal.     Breath sounds: Normal breath sounds.  Abdominal:     General: Bowel sounds are normal.     Palpations: Abdomen is soft.  Musculoskeletal:        General: Normal range of motion.     Cervical back: Normal range of motion and neck supple.  Skin:    General: Skin is warm.     Capillary Refill: Capillary refill takes less than 2 seconds.  Neurological:     General: No focal deficit present.     Mental Status: She is alert and oriented to person, place, and time.  Psychiatric:        Mood and Affect: Mood normal.        Behavior: Behavior normal.     ED Results / Procedures / Treatments   Labs (all labs ordered are listed, but only abnormal results are displayed) Labs Reviewed  BASIC METABOLIC PANEL - Abnormal; Notable for the following components:      Result Value   Glucose, Bld 131 (*)    Calcium 8.6 (*)    All other  components within normal limits  CBC  TROPONIN I (HIGH SENSITIVITY)  TROPONIN I (HIGH SENSITIVITY)    EKG EKG Interpretation Date/Time:  Thursday July 02 2023 20:31:41 EDT Ventricular Rate:  106 PR Interval:  140 QRS Duration:  94 QT Interval:  340 QTC Calculation: 452 R Axis:   0  Text Interpretation: Sinus tachycardia Low voltage, precordial leads Borderline T abnormalities, anterior leads No significant change since last tracing Confirmed by Richardean Canal [id number]) on 07/02/2023 10:17:13 PM  Radiology DG Chest 2 View  Result Date: 07/02/2023 CLINICAL DATA:  Chest pain EXAM: CHEST - 2 VIEW COMPARISON:  04/03/2021 FINDINGS: Elevation of the right hemidiaphragm. Heart and mediastinal contours are within normal limits. No focal opacities or effusions. No acute bony abnormality. IMPRESSION: No active cardiopulmonary disease. Electronically Signed   By: Charlett Nose M.D.   On: 07/02/2023 21:09     Procedures Procedures    Medications Ordered in ED Medications  famotidine (PEPCID) IVPB 20 mg premix (20 mg Intravenous New Bag/Given 07/02/23 2241)  ondansetron (ZOFRAN) injection 4 mg (4 mg Intravenous Given 07/02/23 2239)  sodium chloride 0.9 % bolus 1,000 mL (1,000 mLs Intravenous New Bag/Given 07/02/23 2238)    ED Course/ Medical Decision Making/ A&P                                 Medical Decision Making [name] is a 52 y.o. female here presenting with chest pain and diaphoresis.  It is worse after she vomited.  Considered gastritis versus ACS versus PE.  Patient has no known cardiac history.  Plan to get CBC and CMP and troponin x 2 and CTA chest.  Will also give Pepcid and Zofran and IV fluids.  11:20 pm Trop neg x 2. Labs unremarkable. CTA pending. Signed out to Dr. Nicanor Alcon. If CTA negative, anticipate dc home with cardiology follow up    Amount and/or Complexity of Data Reviewed Labs: ordered. Radiology: ordered. ECG/medicine tests: ordered.  Risk Prescription drug management.    Final Clinical Impression(s) / ED Diagnoses Final diagnoses:  None    Rx / DC Orders ED Discharge Orders     None         Charlynne Pander, MD 07/03/23 1555

## 2023-07-02 NOTE — Discharge Instructions (Signed)
Your workup in the ED is unremarkable today.  I recommend taking Nexium 40 mg daily.  If you have persistent chest pain you should follow-up with cardiology  Return to ER if you have worse chest pain or shortness of breath

## 2023-07-02 NOTE — ED Triage Notes (Signed)
Patient reports sudden onset of generalized chest pain that started approx 40 minutes ago. Patient denies radiation of pain and describes it as sharp. Patient states pain has gotten less severe. Endorses nausea during episode.

## 2023-07-03 ENCOUNTER — Emergency Department (HOSPITAL_BASED_OUTPATIENT_CLINIC_OR_DEPARTMENT_OTHER): Payer: 59

## 2023-07-03 MED ORDER — IOHEXOL 350 MG/ML SOLN
75.0000 mL | Freq: Once | INTRAVENOUS | Status: AC | PRN
  Administered 2023-07-03: 75 mL via INTRAVENOUS

## 2023-07-03 NOTE — ED Notes (Signed)
Discharge instructions provided by edp were discussed with pt. Pt verbalized understanding with no additional questions at this time. Pt going home with s/o
